# Patient Record
Sex: Female | Born: 2000 | Race: Black or African American | Hispanic: No | Marital: Single | Smoking: Former smoker
Health system: Southern US, Community
[De-identification: ages and names within clinical notes are randomized; demographics above are authoritative.]

## PROBLEM LIST (undated history)

## (undated) DIAGNOSIS — Z872 Personal history of diseases of the skin and subcutaneous tissue: Secondary | ICD-10-CM

## (undated) DIAGNOSIS — Z789 Other specified health status: Secondary | ICD-10-CM

## (undated) HISTORY — DX: Other specified health status: Z78.9

## (undated) HISTORY — DX: Personal history of diseases of the skin and subcutaneous tissue: Z87.2

## (undated) HISTORY — PX: NO PAST SURGERIES: SHX2092

---
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
## 2018-02-20 ENCOUNTER — Ambulatory Visit (INDEPENDENT_AMBULATORY_CARE_PROVIDER_SITE_OTHER): Payer: Self-pay | Admitting: Internal Medicine

## 2018-02-20 ENCOUNTER — Other Ambulatory Visit (HOSPITAL_COMMUNITY)
Admission: RE | Admit: 2018-02-20 | Discharge: 2018-02-20 | Disposition: A | Discharge status: Home / Self Care | Payer: Medicaid Other | Source: Ambulatory Visit | Attending: Internal Medicine | Admitting: Internal Medicine

## 2018-02-20 ENCOUNTER — Encounter (INDEPENDENT_AMBULATORY_CARE_PROVIDER_SITE_OTHER): Payer: Self-pay | Admitting: Internal Medicine

## 2018-02-20 VITALS — BP 112/79 | HR 101 | Temp 98.1°F | Ht 63.0 in | Wt 125.4 lb

## 2018-02-20 DIAGNOSIS — Z113 Encounter for screening for infections with a predominantly sexual mode of transmission: Secondary | ICD-10-CM | POA: Insufficient documentation

## 2018-02-20 DIAGNOSIS — Z3202 Encounter for pregnancy test, result negative: Secondary | ICD-10-CM

## 2018-02-20 DIAGNOSIS — N911 Secondary amenorrhea: Secondary | ICD-10-CM

## 2018-02-20 DIAGNOSIS — L309 Dermatitis, unspecified: Secondary | ICD-10-CM

## 2018-02-20 LAB — POCT URINE PREGNANCY: Preg Test, Ur: NEGATIVE

## 2018-02-20 MED ORDER — HYDROXYZINE HCL 25 MG PO TABS: 25.0000 mg | ORAL_TABLET | Freq: Every day | ORAL | 0 refills | Status: DC

## 2018-02-20 NOTE — Progress Notes (Signed)
Patient ID: Molly Evans, female    DOB: 02/19/2000  MRN: [id number]  CC: Exposure to STD   Subjective: Molly Evans is a 18 y.o. female who presents for UC visit.   Her concerns today include:   Pt requesting STD screen.  No vaginal dischg or itching at this time.  Sexually active with 2 partners in past one yr. requesting STD check because on her last intermittent encounter she states that her boyfriend took off the condom.  She declined blood draw for HIV and syphilis screen.  Seen by derm at [facility] for chronic rash and itching.  Pt states she was told that she has dry skin. Prescribed  a pill ?Zyrtec and did not find it helpful.  Also prescribed a cream (Halobetalol) but it is not covered by her Medicaid.  She would like refill on hydroxyzine.  She reports that this medication has been helpful in decreasing the itching.  Has a f/u appt later this mth.   Patient Active Problem List   Diagnosis Date Noted  . Chlamydia infection 04/20/2017     Current Outpatient Medications on File Prior to Visit  Medication Sig Dispense Refill  . hydrOXYzine (ATARAX/VISTARIL) 25 MG tablet Take 1 tablet (25 mg total) by mouth at bedtime. 30 tablet 1  . medroxyPROGESTERone (DEPO-PROVERA) 150 MG/ML injection Inject 1 mL (150 mg total) into the muscle every 3 (three) months. 1 mL 3   No current facility-administered medications on file prior to visit.     No Known Allergies  Social History   Socioeconomic History  . Marital status: Single    Spouse name: Not on file  . Number of children: Not on file  . Years of education: Not on file  . Highest education level: Not on file  Occupational History  . Not on file  Social Needs  . Financial resource strain: Not on file  . Food insecurity:    Worry: Not on file    Inability: Not on file  . Transportation needs:    Medical: Not on file    Non-medical: Not on file  Tobacco Use  . Smoking status: Current Every Day  Smoker    Types: Cigars  . Smokeless tobacco: Never Used  Substance and Sexual Activity  . Alcohol use: No  . Drug use: No  . Sexual activity: Yes    Partners: Male    Birth control/protection: Condom  Lifestyle  . Physical activity:    Days per week: Not on file    Minutes per session: Not on file  . Stress: Not on file  Relationships  . Social connections:    Talks on phone: Not on file    Gets together: Not on file    Attends religious service: Not on file    Active member of club or organization: Not on file    Attends meetings of clubs or organizations: Not on file    Relationship status: Not on file  . Intimate partner violence:    Fear of current or ex partner: Not on file    Emotionally abused: Not on file    Physically abused: Not on file    Forced sexual activity: Not on file  Other Topics Concern  . Not on file  Social History Narrative   8th grade, desiring to go out for track 2014, does hair styling, exercise - running    Family History  Problem Relation Age of Onset  . Hypertension Mother   .  Heart disease Maternal Uncle   . Cancer Neg Hx   . Diabetes Neg Hx     No past surgical history on file.  ROS: Review of Systems Negative except as above. PHYSICAL EXAM: BP 112/79 (BP Location: Left Arm, Patient Position: Sitting, Cuff Size: Normal)   Pulse (!) 101   Temp 98.1 F (36.7 C) (Oral)   Ht 5\' 3"  (1.6 m)   Wt 125 lb 6.4 oz (56.9 kg)   LMP 01/20/2018 (Approximate)   SpO2 93%   BMI 22.21 kg/m   Physical Exam  General appearance - alert, well appearing, and in no distress Mental status - normal mood, behavior, speech, dress, motor activity, and thought processes Skin -scattered hyperpigmented macular rash on the right thigh, left popliteal area and upper arms.  Results for orders placed or performed in visit on 02/20/18  POCT urine pregnancy  Result Value Ref Range   Preg Test, Ur Negative Negative    ASSESSMENT AND PLAN: 1. Routine  screening for STI (sexually transmitted infection) Encourage consistent use of condoms.  Patient declines screening test for HIV and syphilis - Urine cytology ancillary only  2. Dermatitis Patient to keep follow-up appointment with dermatologist later this month. - hydrOXYzine (ATARAX/VISTARIL) 25 MG tablet; Take 1 tablet (25 mg total) by mouth at bedtime.  Dispense: 30 tablet; Refill: 0  3. Secondary amenorrhea - POCT urine pregnancy  Patient was given the opportunity to ask questions.  Patient verbalized understanding of the plan and was able to repeat key elements of the plan.   No orders of the defined types were placed in this encounter.    Requested Prescriptions    No prescriptions requested or ordered in this encounter    No follow-ups on file.  Jonah Blue, MD, FACP

## 2018-02-20 NOTE — Patient Instructions (Signed)
I recommend using condoms consistently to prevent acquiring a sexually transmitted infection.

## 2018-02-21 LAB — URINE CYTOLOGY ANCILLARY ONLY
CHLAMYDIA, DNA PROBE: NEGATIVE
Neisseria Gonorrhea: NEGATIVE
TRICH (WINDOWPATH): NEGATIVE

## 2018-02-23 ENCOUNTER — Telehealth: Payer: Self-pay

## 2018-02-23 NOTE — Telephone Encounter (Signed)
Contacted pt to go over lab results pt answered then hung up. Tried contacting pt back and there was no answer

## 2018-03-10 ENCOUNTER — Ambulatory Visit (HOSPITAL_COMMUNITY)
Admission: EM | Admit: 2018-03-10 | Discharge: 2018-03-10 | Disposition: A | Discharge status: Home / Self Care | Payer: Self-pay | Attending: Family Medicine | Admitting: Family Medicine

## 2018-03-10 ENCOUNTER — Encounter (HOSPITAL_COMMUNITY): Payer: Self-pay | Admitting: Emergency Medicine

## 2018-03-10 DIAGNOSIS — L2082 Flexural eczema: Secondary | ICD-10-CM

## 2018-03-10 MED ORDER — CETIRIZINE HCL 10 MG PO TABS: 10.0000 mg | ORAL_TABLET | Freq: Two times a day (BID) | ORAL | 0 refills | Status: DC

## 2018-03-10 MED ORDER — PREDNISONE 50 MG PO TABS: ORAL_TABLET | ORAL | 0 refills | Status: DC

## 2018-03-10 MED ORDER — TRIAMCINOLONE ACETONIDE 0.1 % EX OINT: 1.0000 "application " | TOPICAL_OINTMENT | Freq: Two times a day (BID) | CUTANEOUS | 0 refills | Status: DC

## 2018-03-10 NOTE — ED Triage Notes (Signed)
Pt here for rash to arm that is irritated

## 2018-03-10 NOTE — ED Provider Notes (Signed)
[facility]    CSN: [id number] Arrival date & time: 03/10/18  1032     History   Chief Complaint Chief Complaint  Patient presents with  . Rash    HPI Molly Evans is a 18 y.o. female.   HPI  Patient has longstanding eczema.  She is under the care of primary care doctor and a dermatologist.  She went to the dermatologist and was prescribed halobetasol.  This is not covered under her insurance.  Cost $300.  She called back to the dermatologist was unable to reach anybody.  She therefore is on treated.  She does have hydroxyzine to use for itching.  If she takes enough to relieve the itching it makes her drowsy.  She is not able to take this at work.  She did not go to work today because the itching was so terrible on her arms and in both axilla that she did not feel able to concentrate on her job.  She states the skin in her armpits "burns".  History reviewed. No pertinent past medical history.  Patient Active Problem List   Diagnosis Date Noted  . Chlamydia infection 04/20/2017    History reviewed. No pertinent surgical history.  OB History   No obstetric history on file.      Home Medications    Prior to Admission medications   Medication Sig Start Date End Date Taking? Authorizing Provider  cetirizine (ZYRTEC) 10 MG tablet Take 1 tablet (10 mg total) by mouth 2 (two) times daily. 03/10/18   Eustace Moore, MD  hydrOXYzine (ATARAX/VISTARIL) 25 MG tablet Take 1 tablet (25 mg total) by mouth at bedtime. 02/20/18   Marcine Matar, MD  medroxyPROGESTERone (DEPO-PROVERA) 150 MG/ML injection Inject 1 mL (150 mg total) into the muscle every 3 (three) months. 12/26/17   Loletta Specter, PA-C  predniSONE (DELTASONE) 50 MG tablet One a day for 3 d 03/10/18   Eustace Moore, MD  triamcinolone ointment (KENALOG) 0.1 % Apply 1 application topically 2 (two) times daily. 03/10/18   Eustace Moore, MD    Family History Family History  Problem Relation  Age of Onset  . Hypertension Mother   . Heart disease Maternal Uncle   . Cancer Neg Hx   . Diabetes Neg Hx     Social History Social History   Tobacco Use  . Smoking status: Current Every Day Smoker    Types: Cigars  . Smokeless tobacco: Never Used  Substance Use Topics  . Alcohol use: No  . Drug use: No     Allergies   Patient has no known allergies.   Review of Systems Review of Systems  Constitutional: Negative for chills and fever.  HENT: Negative for ear pain and sore throat.   Eyes: Negative for pain and visual disturbance.  Respiratory: Negative for cough and shortness of breath.   Cardiovascular: Negative for chest pain and palpitations.  Gastrointestinal: Negative for abdominal pain and vomiting.  Genitourinary: Negative for dysuria and hematuria.  Musculoskeletal: Negative for arthralgias and back pain.  Skin: Positive for rash. Negative for color change.  Neurological: Negative for seizures and syncope.  All other systems reviewed and are negative.    Physical Exam Triage Vital Signs ED Triage Vitals [03/10/18 1056]  Enc Vitals Group     BP (!) 121/58     Pulse Rate 74     Resp 18     Temp 98.4 F (36.9 C)  Temp Source Temporal     SpO2 100 %     Weight      Height      Head Circumference      Peak Flow      Pain Score 0     Pain Loc      Pain Edu?      Excl. in GC?    No data found.  Updated Vital Signs BP (!) 121/58 (BP Location: Right Arm)   Pulse 74   Temp 98.4 F (36.9 C) (Temporal)   Resp 18   SpO2 100%   Visual Acuity Right Eye Distance:   Left Eye Distance:   Bilateral Distance:    Right Eye Near:   Left Eye Near:    Bilateral Near:     Physical Exam Constitutional:      General: She is not in acute distress.    Appearance: She is well-developed.  HENT:     Head: Normocephalic and atraumatic.  Eyes:     Conjunctiva/sclera: Conjunctivae normal.     Pupils: Pupils are equal, round, and reactive to light.    Neck:     Musculoskeletal: Normal range of motion.  Cardiovascular:     Rate and Rhythm: Normal rate.  Pulmonary:     Effort: Pulmonary effort is normal. No respiratory distress.  Abdominal:     General: There is no distension.     Palpations: Abdomen is soft.  Musculoskeletal: Normal range of motion.  Skin:    General: Skin is warm and dry.     Comments: Hyperpigmented, rough, inflamed skin present on both antecubital fossa, both deltoid region of upper arm and both axilla, no evidence of cellulitis infection or exudate  Neurological:     General: No focal deficit present.     Mental Status: She is alert.  Psychiatric:        Mood and Affect: Mood normal.        Behavior: Behavior normal.      UC Treatments / Results  Labs (all labs ordered are listed, but only abnormal results are displayed) Labs Reviewed - No data to display  EKG None  Radiology No results found.  Procedures Procedures (including critical care time)  Medications Ordered in UC Medications - No data to display  Initial Impression / Assessment and Plan / UC Course  I have reviewed the triage vital signs and the nursing notes.  Pertinent labs & imaging results that were available during my care of the patient were reviewed by me and considered in my medical decision making (see chart for details).      Final Clinical Impressions(s) / UC Diagnoses   Final diagnoses:  Flexural eczema     Discharge Instructions     The cetirizine (Zyrtec) 2 times a day Take prednisone 1 a day for 3 days Use the triamcinolone ointment 2 times a day Follow-up with your dermatologist next week   ED Prescriptions    Medication Sig Dispense Auth. Provider   cetirizine (ZYRTEC) 10 MG tablet Take 1 tablet (10 mg total) by mouth 2 (two) times daily. 30 tablet Eustace MooreNelson, Ramya Vanbergen Sue, MD   triamcinolone ointment (KENALOG) 0.1 % Apply 1 application topically 2 (two) times daily. 30 g Eustace MooreNelson, Anjoli Diemer Sue, MD    predniSONE (DELTASONE) 50 MG tablet One a day for 3 d 3 tablet Eustace MooreNelson, Jered Heiny Sue, MD     Controlled Substance Prescriptions [place] Controlled Substance Registry consulted? Not Applicable   Eustace Mooreelson, Raynald Rouillard Sue,  MD 03/10/18 1134

## 2018-03-10 NOTE — Discharge Instructions (Signed)
The cetirizine (Zyrtec) 2 times a day Take prednisone 1 a day for 3 days Use the triamcinolone ointment 2 times a day Follow-up with your dermatologist next week

## 2018-03-23 ENCOUNTER — Ambulatory Visit (HOSPITAL_COMMUNITY)
Admission: EM | Admit: 2018-03-23 | Discharge: 2018-03-23 | Disposition: A | Discharge status: Home / Self Care | Payer: Self-pay | Attending: Family Medicine | Admitting: Family Medicine

## 2018-03-23 ENCOUNTER — Encounter (HOSPITAL_COMMUNITY): Payer: Self-pay | Admitting: Emergency Medicine

## 2018-03-23 DIAGNOSIS — R21 Rash and other nonspecific skin eruption: Secondary | ICD-10-CM

## 2018-03-23 MED ORDER — TRIAMCINOLONE ACETONIDE 0.1 % EX CREA: 1.0000 "application " | TOPICAL_CREAM | Freq: Two times a day (BID) | CUTANEOUS | 0 refills | Status: DC

## 2018-03-23 NOTE — ED Provider Notes (Signed)
[facility]   [id number] 03/23/18 Arrival Time: 1132  ASSESSMENT & PLAN:  1. Rash and nonspecific skin eruption     Meds ordered this encounter  Medications  . triamcinolone cream (KENALOG) 0.1 %    Sig: Apply 1 application topically 2 (two) times daily. For no more than one week.    Dispense:  15 g    Refill:  0   Will f/u here if not seeing improvement over the next week.  Reviewed expectations re: course of current medical issues. Questions answered. Outlined signs and symptoms indicating need for more acute intervention. Patient verbalized understanding. After Visit Summary given.   SUBJECTIVE:  Molly Evans is a 18 y.o. female who presents with a skin complaint.   Location: R cheek along mandible towards ear Onset: abrupt Duration: 2-3 days of itching; "small bumps" noted yesterday Associated pruritis? moderate Associated pain? none Drainage? No  Known trigger? No  New soaps/lotions/topicals/detergents/environmental exposures? No Contacts with similar? No Recent travel? No  Other associated symptoms: none Therapies tried thus far: none Arthralgia or myalgia? none Recent illness? none Fever? none No specific aggravating or alleviating factors reported. No h/o similar. H/O eczema though.  ROS: As per HPI.  OBJECTIVE: Vitals:   03/23/18 1156  BP: 104/61  Pulse: 66  Resp: 18  Temp: 98.6 F (37 C)  TempSrc: Temporal  SpO2: 100%    General appearance: alert; no distress Lungs: clear to auscultation bilaterally Heart: regular rate and rhythm Extremities: no edema Skin: warm and dry; signs of infection: no; along R mandible extending toward ear is an subtle papular rash; minimal erythema; no blistering or ulcerations; no tenderness to touch Psychological: alert and cooperative; normal mood and affect  No Known Allergies   Social History   Socioeconomic History  . Marital status: Single    Spouse name: Not on file  . Number of  children: Not on file  . Years of education: Not on file  . Highest education level: Not on file  Occupational History  . Not on file  Social Needs  . Financial resource strain: Not on file  . Food insecurity:    Worry: Not on file    Inability: Not on file  . Transportation needs:    Medical: Not on file    Non-medical: Not on file  Tobacco Use  . Smoking status: Current Every Day Smoker    Types: Cigars  . Smokeless tobacco: Never Used  Substance and Sexual Activity  . Alcohol use: No  . Drug use: No  . Sexual activity: Yes    Partners: Male    Birth control/protection: Condom  Lifestyle  . Physical activity:    Days per week: Not on file    Minutes per session: Not on file  . Stress: Not on file  Relationships  . Social connections:    Talks on phone: Not on file    Gets together: Not on file    Attends religious service: Not on file    Active member of club or organization: Not on file    Attends meetings of clubs or organizations: Not on file    Relationship status: Not on file  . Intimate partner violence:    Fear of current or ex partner: Not on file    Emotionally abused: Not on file    Physically abused: Not on file    Forced sexual activity: Not on file  Other Topics Concern  . Not on file  Social  History Narrative   8th grade, desiring to go out for track 2014, does hair styling, exercise - running   Family History  Problem Relation Age of Onset  . Hypertension Mother   . Heart disease Maternal Uncle   . Cancer Neg Hx   . Diabetes Neg Hx    History reviewed. No pertinent surgical history.   Mardella Layman, MD 03/23/18 1349

## 2018-03-23 NOTE — ED Triage Notes (Signed)
Pt sts rash to face

## 2018-03-24 ENCOUNTER — Ambulatory Visit (INDEPENDENT_AMBULATORY_CARE_PROVIDER_SITE_OTHER): Payer: Self-pay | Admitting: Primary Care

## 2018-03-24 ENCOUNTER — Encounter (INDEPENDENT_AMBULATORY_CARE_PROVIDER_SITE_OTHER): Payer: Self-pay | Admitting: Primary Care

## 2018-03-24 ENCOUNTER — Other Ambulatory Visit: Payer: Self-pay

## 2018-03-24 VITALS — BP 110/75 | HR 83 | Temp 97.7°F | Ht 63.0 in | Wt 123.0 lb

## 2018-03-24 DIAGNOSIS — Z7251 High risk heterosexual behavior: Secondary | ICD-10-CM

## 2018-03-24 DIAGNOSIS — Z23 Encounter for immunization: Secondary | ICD-10-CM

## 2018-03-24 DIAGNOSIS — L259 Unspecified contact dermatitis, unspecified cause: Secondary | ICD-10-CM

## 2018-03-24 NOTE — Progress Notes (Signed)
Acute Office Visit  Subjective:    Patient ID: Molly Evans, female    DOB: 10-22-2000, 18 y.o.   MRN: [id number]  Chief Complaint  Patient presents with  . Rash    right side of face     HPI Patient is in today for an acute visit for contact dermatitis rash/hive on face. Patient admits to staying over a friends house dogs and not sure what detergent used on sheets. PMH of STD and eczema.  History reviewed. No pertinent past medical history.  History reviewed. No pertinent surgical history.  Family History  Problem Relation Age of Onset  . Hypertension Mother   . Heart disease Maternal Uncle   . Cancer Neg Hx   . Diabetes Neg Hx     Social History   Socioeconomic History  . Marital status: Single    Spouse name: Not on file  . Number of children: Not on file  . Years of education: Not on file  . Highest education level: Not on file  Occupational History  . Not on file  Social Needs  . Financial resource strain: Not on file  . Food insecurity:    Worry: Not on file    Inability: Not on file  . Transportation needs:    Medical: Not on file    Non-medical: Not on file  Tobacco Use  . Smoking status: Current Every Day Smoker    Types: Cigars  . Smokeless tobacco: Never Used  Substance and Sexual Activity  . Alcohol use: No  . Drug use: No  . Sexual activity: Yes    Partners: Male    Birth control/protection: Condom  Lifestyle  . Physical activity:    Days per week: Not on file    Minutes per session: Not on file  . Stress: Not on file  Relationships  . Social connections:    Talks on phone: Not on file    Gets together: Not on file    Attends religious service: Not on file    Active member of club or organization: Not on file    Attends meetings of clubs or organizations: Not on file    Relationship status: Not on file  . Intimate partner violence:    Fear of current or ex partner: Not on file    Emotionally abused: Not on file    Physically  abused: Not on file    Forced sexual activity: Not on file  Other Topics Concern  . Not on file  Social History Narrative   8th grade, desiring to go out for track 2014, does hair styling, exercise - running    Outpatient Medications Prior to Visit  Medication Sig Dispense Refill  . cetirizine (ZYRTEC) 10 MG tablet Take 1 tablet (10 mg total) by mouth 2 (two) times daily. (Patient not taking: Reported on 03/24/2018) 30 tablet 0  . hydrOXYzine (ATARAX/VISTARIL) 25 MG tablet Take 1 tablet (25 mg total) by mouth at bedtime. (Patient not taking: Reported on 03/24/2018) 30 tablet 0  . triamcinolone cream (KENALOG) 0.1 % Apply 1 application topically 2 (two) times daily. For no more than one week. (Patient not taking: Reported on 03/24/2018) 15 g 0  . medroxyPROGESTERone (DEPO-PROVERA) 150 MG/ML injection Inject 1 mL (150 mg total) into the muscle every 3 (three) months. 1 mL 3  . predniSONE (DELTASONE) 50 MG tablet One a day for 3 d 3 tablet 0   No facility-administered medications prior to visit.  No Known Allergies  Review of Systems  Constitutional: Negative.   HENT: Negative.   Eyes: Negative.   Respiratory: Negative.   Cardiovascular: Negative.   Gastrointestinal: Negative.   Genitourinary: Negative.   Musculoskeletal: Negative.   Skin: Positive for rash.  Neurological: Negative.   Endo/Heme/Allergies: Negative.   Psychiatric/Behavioral: Negative.        Objective:    Physical Exam  Constitutional: She is oriented to person, place, and time. She appears well-developed.  HENT:  Head: Normocephalic.  Neck: Normal range of motion.  Cardiovascular: Normal rate and regular rhythm.  Pulmonary/Chest: Breath sounds normal.  Musculoskeletal: Normal range of motion.  Neurological: She is alert and oriented to person, place, and time.  Skin: Rash noted.  Psychiatric: She has a normal mood and affect.    BP 110/75 (BP Location: Right Arm, Patient Position: Sitting, Cuff Size:  Normal)   Pulse 83   Temp 97.7 F (36.5 C) (Oral)   Ht _0  (1.6 m)   Wt 123 lb (55.8 kg)   LMP 03/03/2018 (Approximate)   SpO2 93%   BMI 21.79 kg/m  Wt Readings from Last 3 Encounters:  03/24/18 123 lb (55.8 kg) (42 %, Z= -0.20)*  02/20/18 125 lb 6.4 oz (56.9 kg) (47 %, Z= -0.07)*  12/26/17 122 lb 12.8 oz (55.7 kg) (43 %, Z= -0.19)*   * Growth percentiles are based on CDC (Girls, 2-20 Years) data.    There are no preventive care reminders to display for this patient.  There are no preventive care reminders to display for this patient.   No results found for: TSH No results found for: WBC, HGB, HCT, MCV, PLT No results found for: NA, K, CHLORIDE, CO2, GLUCOSE, BUN, CREATININE, BILITOT, ALKPHOS, AST, ALT, PROT, ALBUMIN, CALCIUM, ANIONGAP, EGFR, GFR No results found for: CHOL No results found for: HDL No results found for: LDLCALC No results found for: TRIG No results found for: CHOLHDL Lab Results  Component Value Date   HGBA1C 5.3 10/24/2017       Assessment & Plan:   Problem List Items Addressed This Visit    None    Visit Diagnoses    Need for Tdap vaccination    -  Primary   Relevant Orders   Tdap vaccine greater than or equal to 7yo IM (Completed)   High risk sexual behavior, unspecified type       Contact dermatitis and eczema        1. Need for Tdap vaccination Completed  - Tdap vaccine greater than or equal to 7yo IM  2. High risk sexual behavior, unspecified type  discussed  STD risk and protection use  Condoms and given samples  3. Contact dermatitis and eczema Benadryl for itching rash hives and has a Dermatology appt scheduled per pt      [name], NP

## 2018-03-24 NOTE — Patient Instructions (Signed)
Contact Dermatitis  Dermatitis is redness, soreness, and swelling (inflammation) of the skin. Contact dermatitis is a reaction to something that touches the skin.  There are two types of contact dermatitis:   Irritant contact dermatitis. This happens when something bothers (irritates) your skin, like soap.   Allergic contact dermatitis. This is caused when you are exposed to something that you are allergic to, such as poison ivy.  What are the causes?   Common causes of irritant contact dermatitis include:  ? Makeup.  ? Soaps.  ? Detergents.  ? Bleaches.  ? Acids.  ? Metals, such as nickel.   Common causes of allergic contact dermatitis include:  ? Plants.  ? Chemicals.  ? Jewelry.  ? Latex.  ? Medicines.  ? Preservatives in products, such as clothing.  What increases the risk?   Having a job that exposes you to things that bother your skin.   Having asthma or eczema.  What are the signs or symptoms?  Symptoms may happen anywhere the irritant has touched your skin. Symptoms include:   Dry or flaky skin.   Redness.   Cracks.   Itching.   Pain or a burning feeling.   Blisters.   Blood or clear fluid draining from skin cracks.  With allergic contact dermatitis, swelling may occur. This may happen in places such as the eyelids, mouth, or genitals.  How is this treated?   This condition is treated by checking for the cause of the reaction and protecting your skin. Treatment may also include:  ? Steroid creams, ointments, or medicines.  ? Antibiotic medicines or other ointments, if you have a skin infection.  ? Lotion or medicines to help with itching.  ? A bandage (dressing).  Follow these instructions at home:  Skin care   Moisturize your skin as needed.   Put cool cloths on your skin.   Put a baking soda paste on your skin. Stir water into baking soda until it looks like a paste.   Do not scratch your skin.   Avoid having things rub up against your skin.   Avoid the use of soaps, perfumes, and  dyes.  Medicines   Take or apply over-the-counter and prescription medicines only as told by your doctor.   If you were prescribed an antibiotic medicine, take or apply it as told by your doctor. Do not stop using it even if your condition starts to get better.  Bathing   Take a bath with:  ? Epsom salts.  ? Baking soda.  ? Colloidal oatmeal.   Bathe less often.   Bathe in warm water. Avoid using hot water.  Bandage care   If you were given a bandage, change it as told by your health care provider.   Wash your hands with soap and water before and after you change your bandage. If soap and water are not available, use hand sanitizer.  General instructions   Avoid the things that caused your reaction. If you do not know what caused it, keep a journal. Write down:  ? What you eat.  ? What skin products you use.  ? What you drink.  ? What you wear in the area that has symptoms. This includes jewelry.   Check the affected areas every day for signs of infection. Check for:  ? More redness, swelling, or pain.  ? More fluid or blood.  ? Warmth.  ? Pus or a bad smell.   Keep all follow-up visits as   told by your doctor. This is important.  Contact a doctor if:   You do not get better with treatment.   Your condition gets worse.   You have signs of infection, such as:  ? More swelling.  ? Tenderness.  ? More redness.  ? Soreness.  ? Warmth.   You have a fever.   You have new symptoms.  Get help right away if:   You have a very bad headache.   You have neck pain.   Your neck is stiff.   You throw up (vomit).   You feel very sleepy.   You see red streaks coming from the area.   Your bone or joint near the area hurts after the skin has healed.   The area turns darker.   You have trouble breathing.  Summary   Dermatitis is redness, soreness, and swelling of the skin.   Symptoms may occur where the irritant has touched you.   Treatment may include medicines and skin care.   If you do not know what caused  your reaction, keep a journal.   Contact a doctor if your condition gets worse or you have signs of infection.  This information is not intended to replace advice given to you by your health care provider. Make sure you discuss any questions you have with your health care provider.  Document Released: 11/01/2008 Document Revised: 07/20/2017 Document Reviewed: 07/20/2017  Elsevier Interactive Patient Education  2019 [employer]

## 2018-03-24 NOTE — Progress Notes (Signed)
tdaPt states she slept over at a friends on Tuesday night woke up Wednesday morning and her face was broke out with bumps. She took benadryl and it cleared the break out slightly.

## 2018-05-23 ENCOUNTER — Other Ambulatory Visit: Payer: Self-pay

## 2018-05-23 ENCOUNTER — Ambulatory Visit (HOSPITAL_COMMUNITY)
Admission: EM | Admit: 2018-05-23 | Discharge: 2018-05-23 | Disposition: A | Discharge status: Home / Self Care | Payer: Medicaid Other | Attending: Family Medicine | Admitting: Family Medicine

## 2018-05-23 ENCOUNTER — Encounter (HOSPITAL_COMMUNITY): Payer: Self-pay

## 2018-05-23 DIAGNOSIS — R1032 Left lower quadrant pain: Secondary | ICD-10-CM | POA: Insufficient documentation

## 2018-05-23 LAB — POCT URINALYSIS DIP (DEVICE)
Bilirubin Urine: NEGATIVE
Glucose, UA: NEGATIVE mg/dL
Ketones, ur: NEGATIVE mg/dL
Nitrite: NEGATIVE
Protein, ur: NEGATIVE mg/dL
Specific Gravity, Urine: 1.025 (ref 1.005–1.030)
Urobilinogen, UA: 0.2 mg/dL (ref 0.0–1.0)
pH: 6 (ref 5.0–8.0)

## 2018-05-23 LAB — CBC WITH DIFFERENTIAL/PLATELET
Abs Immature Granulocytes: 0.01 10*3/uL (ref 0.00–0.07)
Basophils Absolute: 0 10*3/uL (ref 0.0–0.1)
Basophils Relative: 0 %
Eosinophils Absolute: 0.1 10*3/uL (ref 0.0–0.5)
Eosinophils Relative: 2 %
HCT: 38.8 % (ref 36.0–46.0)
Hemoglobin: 12.7 g/dL (ref 12.0–15.0)
Immature Granulocytes: 0 %
Lymphocytes Relative: 27 %
Lymphs Abs: 1.7 10*3/uL (ref 0.7–4.0)
MCH: 27 pg (ref 26.0–34.0)
MCHC: 32.7 g/dL (ref 30.0–36.0)
MCV: 82.4 fL (ref 80.0–100.0)
Monocytes Absolute: 0.5 10*3/uL (ref 0.1–1.0)
Monocytes Relative: 8 %
Neutro Abs: 4 10*3/uL (ref 1.7–7.7)
Neutrophils Relative %: 63 %
Platelets: 240 10*3/uL (ref 150–400)
RBC: 4.71 MIL/uL (ref 3.87–5.11)
RDW: 12.9 % (ref 11.5–15.5)
WBC: 6.4 10*3/uL (ref 4.0–10.5)
nRBC: 0 % (ref 0.0–0.2)

## 2018-05-23 LAB — BASIC METABOLIC PANEL
Anion gap: 6 (ref 5–15)
BUN: 6 mg/dL (ref 6–20)
CO2: 25 mmol/L (ref 22–32)
Calcium: 8.8 mg/dL — ABNORMAL LOW (ref 8.9–10.3)
Chloride: 109 mmol/L (ref 98–111)
Creatinine, Ser: 0.88 mg/dL (ref 0.44–1.00)
GFR calc Af Amer: >60 mL/min (ref >60)
GFR calc non Af Amer: >60 mL/min (ref >60)
Glucose, Bld: 87 mg/dL (ref 70–99)
Potassium: 3.8 mmol/L (ref 3.5–5.1)
Sodium: 140 mmol/L (ref 135–145)

## 2018-05-23 LAB — POCT PREGNANCY, URINE: Preg Test, Ur: NEGATIVE

## 2018-05-23 NOTE — Discharge Instructions (Signed)
You have been seen today for abdominal pain. Your evaluation was not suggestive of any emergent condition requiring medical intervention at this time. However, some abdominal problems make take more time to appear. Therefore, it is very important for you to pay attention to any new symptoms or worsening of your current condition.  Please proceed to the Emergency Department immediately should you begin to feel worse in any way or have any of the following symptoms: increasing or different abdominal pain, persistent vomiting, inability to drink fluids, fevers, or shaking chills.    

## 2018-05-23 NOTE — ED Triage Notes (Signed)
Pt states that she has had 2 periods this month and has been having left sided flank pain, states a 10 lb weight loss past month without change in eating habits. States also that she has experienced constipation, last bm today

## 2018-05-23 NOTE — ED Provider Notes (Signed)
[facility]   [id number] 05/23/18 Arrival Time: 1434  ASSESSMENT & PLAN:  1. Left lower quadrant abdominal pain    Benign abdominal exam. No indications for urgent abdominal/pelvic imaging at this time. Discussed possibility of ovarian cyst or nephrolithiasis given hematuria. No signs of PID. UPT negative. No significant abnormalities on CBC/BMP. Urine culture sent. Vaginal cytology pending.   Discharge Instructions     You have been seen today for abdominal pain. Your evaluation was not suggestive of any emergent condition requiring medical intervention at this time. However, some abdominal problems make take more time to appear. Therefore, it is very important for you to pay attention to any new symptoms or worsening of your current condition.  Please proceed to the Emergency Department immediately should you begin to feel worse in any way or have any of the following symptoms: increasing or different abdominal pain, persistent vomiting, inability to drink fluids, fevers, or shaking chills.    Follow-up Information    Grayce SessionsEdwards, Michelle P, NP.   Specialty:  Internal Medicine Why:  As needed. Contact information: [address] [place] [id number] [phone]        [facility].   Specialty:  Emergency Medicine Why:  If your symptoms worsen in any way. Contact information: [address] [id number] [place] [id number] [phone]         Reviewed expectations re: course of current medical issues. Questions answered. Outlined signs and symptoms indicating need for more acute intervention. Patient verbalized understanding. After Visit Summary given.   SUBJECTIVE: History from: patient. Molly Evans is a 18 y.o. female who presents with complaint of intermittent LLQ abdominal discomfort. Onset gradual, approx 4-5 days ago. Discomfort described as aching; without radiation. Symptoms  are gradually improving since beginning. Fever: absent. Aggravating factors: have not been identified. Alleviating factors: have not been identified. Associated symptoms: none reported. She denies anorexia, arthralgias, belching, chills, constipation, diarrhea, headache, myalgias, nausea and vomiting. Appetite: normal. PO intake: normal. Ambulatory without assistance. Urinary symptoms: none. Bowel movements: are less frequent than before but does report having a normal bowel movement this morning without bleeding. History of similar: no. OTC treatment: Tylenol with mild help.  Patient's last menstrual period was 05/16/2018. "Did have two periods this month."  History reviewed. No pertinent surgical history.  ROS: As per HPI. All other systems negative.  OBJECTIVE:  Vitals:   05/23/18 1459  BP: 107/61  Pulse: 72  Resp: 16  Temp: 97.7 F (36.5 C)  SpO2: 100%    General appearance: alert, oriented, no acute distress Lungs: clear to auscultation bilaterally; unlabored respirations Heart: regular rate and rhythm Abdomen: soft; without distention; mild tenderness over LLQ toward inguinal area; normal bowel sounds; without masses or organomegaly; without guarding or rebound tenderness GU: declines Back: without CVA tenderness; FROM at waist Extremities: without LE edema; symmetrical; without gross deformities Skin: warm and dry Neurologic: normal gait Psychological: alert and cooperative; normal mood and affect  Labs: Results for orders placed or performed during the hospital encounter of 05/23/18  Basic metabolic panel  Result Value Ref Range   Sodium 140 135 - 145 mmol/L   Potassium 3.8 3.5 - 5.1 mmol/L   Chloride 109 98 - 111 mmol/L   CO2 25 22 - 32 mmol/L   Glucose, Bld 87 70 - 99 mg/dL   BUN 6 6 - 20 mg/dL   Creatinine, Ser 2.720.88 0.44 - 1.00 mg/dL   Calcium 8.8 (L) 8.9 -  10.3 mg/dL   GFR calc non Af Amer >60 >60 mL/min   GFR calc Af Amer >60 >60 mL/min   Anion gap 6 5 - 15   CBC with Differential  Result Value Ref Range   WBC 6.4 4.0 - 10.5 K/uL   RBC 4.71 3.87 - 5.11 MIL/uL   Hemoglobin 12.7 12.0 - 15.0 g/dL   HCT 16.1 09.6 - 04.5 %   MCV 82.4 80.0 - 100.0 fL   MCH 27.0 26.0 - 34.0 pg   MCHC 32.7 30.0 - 36.0 g/dL   RDW 40.9 81.1 - 91.4 %   Platelets 240 150 - 400 K/uL   nRBC 0.0 0.0 - 0.2 %   Neutrophils Relative % 63 %   Neutro Abs 4.0 1.7 - 7.7 K/uL   Lymphocytes Relative 27 %   Lymphs Abs 1.7 0.7 - 4.0 K/uL   Monocytes Relative 8 %   Monocytes Absolute 0.5 0.1 - 1.0 K/uL   Eosinophils Relative 2 %   Eosinophils Absolute 0.1 0.0 - 0.5 K/uL   Basophils Relative 0 %   Basophils Absolute 0.0 0.0 - 0.1 K/uL   Immature Granulocytes 0 %   Abs Immature Granulocytes 0.01 0.00 - 0.07 K/uL  POCT urinalysis dip (device)  Result Value Ref Range   Glucose, UA NEGATIVE NEGATIVE mg/dL   Bilirubin Urine NEGATIVE NEGATIVE   Ketones, ur NEGATIVE NEGATIVE mg/dL   Specific Gravity, Urine 1.025 1.005 - 1.030   Hgb urine dipstick LARGE (A) NEGATIVE   pH 6.0 5.0 - 8.0   Protein, ur NEGATIVE NEGATIVE mg/dL   Urobilinogen, UA 0.2 0.0 - 1.0 mg/dL   Nitrite NEGATIVE NEGATIVE   Leukocytes,Ua SMALL (A) NEGATIVE  Pregnancy, urine POC  Result Value Ref Range   Preg Test, Ur NEGATIVE NEGATIVE    No Known Allergies                                              Social History   Socioeconomic History  . Marital status: Single    Spouse name: Not on file  . Number of children: Not on file  . Years of education: Not on file  . Highest education level: Not on file  Occupational History  . Not on file  Social Needs  . Financial resource strain: Not on file  . Food insecurity:    Worry: Not on file    Inability: Not on file  . Transportation needs:    Medical: Not on file    Non-medical: Not on file  Tobacco Use  . Smoking status: Current Every Day Smoker    Types: Cigars  . Smokeless tobacco: Never Used  Substance and Sexual Activity  . Alcohol use: No  .  Drug use: No  . Sexual activity: Yes    Partners: Male    Birth control/protection: Condom  Lifestyle  . Physical activity:    Days per week: Not on file    Minutes per session: Not on file  . Stress: Not on file  Relationships  . Social connections:    Talks on phone: Not on file    Gets together: Not on file    Attends religious service: Not on file    Active member of club or organization: Not on file    Attends meetings of clubs or organizations: Not on file  Relationship status: Not on file  . Intimate partner violence:    Fear of current or ex partner: Not on file    Emotionally abused: Not on file    Physically abused: Not on file    Forced sexual activity: Not on file  Other Topics Concern  . Not on file  Social History Narrative   8th grade, desiring to go out for track 2014, does hair styling, exercise - running   Family History  Problem Relation Age of Onset  . Hypertension Mother   . Heart disease Maternal Uncle   . Cancer Neg Hx   . Diabetes Neg Hx      Mardella Layman, MD 05/24/18 [phone]

## 2018-05-24 LAB — URINE CULTURE: Culture: NO GROWTH

## 2018-05-24 LAB — CERVICOVAGINAL ANCILLARY ONLY
Bacterial vaginitis: POSITIVE — AB
Candida vaginitis: POSITIVE — AB
Chlamydia: NEGATIVE
Neisseria Gonorrhea: NEGATIVE
Trichomonas: NEGATIVE

## 2018-05-25 ENCOUNTER — Telehealth (HOSPITAL_COMMUNITY): Payer: Self-pay | Admitting: Emergency Medicine

## 2018-05-25 MED ORDER — METRONIDAZOLE 500 MG PO TABS: 500.0000 mg | ORAL_TABLET | Freq: Two times a day (BID) | ORAL | 0 refills | Status: AC

## 2018-05-25 MED ORDER — FLUCONAZOLE 150 MG PO TABS: 150.0000 mg | ORAL_TABLET | Freq: Once | ORAL | 0 refills | Status: AC

## 2018-05-25 NOTE — Telephone Encounter (Signed)
Normal Labs  Bacterial vaginosis is positive. This was not treated at the urgent care visit.  Flagyl 500 mg BID x 7 days #14 no refills sent to patients pharmacy of choice.    Test for candida (yeast) was positive.  Prescription for fluconazole 150mg  po now, repeat dose in 3d if needed, #2 no refills, sent to the pharmacy of record.  Recheck or followup with PCP for further evaluation if symptoms are not improving.    Attempted to reach patient. No answer at this time. Voicemail left.

## 2018-05-26 ENCOUNTER — Telehealth (HOSPITAL_COMMUNITY): Payer: Self-pay | Admitting: Emergency Medicine

## 2018-05-26 NOTE — Telephone Encounter (Signed)
Attempted to reach patient x2. Mother answered, stated she was at work, will call back either today or tomorrow.

## 2018-05-26 NOTE — Telephone Encounter (Signed)
Patient contacted and made aware of all results, all questions answered.   

## 2018-07-07 ENCOUNTER — Other Ambulatory Visit: Payer: Self-pay

## 2018-07-07 ENCOUNTER — Encounter (HOSPITAL_COMMUNITY): Payer: Self-pay | Admitting: Emergency Medicine

## 2018-07-07 ENCOUNTER — Ambulatory Visit (HOSPITAL_COMMUNITY)
Admission: EM | Admit: 2018-07-07 | Discharge: 2018-07-07 | Disposition: A | Discharge status: Home / Self Care | Payer: Self-pay | Attending: Internal Medicine | Admitting: Internal Medicine

## 2018-07-07 DIAGNOSIS — R102 Pelvic and perineal pain: Secondary | ICD-10-CM

## 2018-07-07 DIAGNOSIS — R1013 Epigastric pain: Secondary | ICD-10-CM

## 2018-07-07 LAB — POCT URINALYSIS DIP (DEVICE)
Bilirubin Urine: NEGATIVE
Glucose, UA: NEGATIVE mg/dL
Hgb urine dipstick: NEGATIVE
Ketones, ur: NEGATIVE mg/dL
Nitrite: NEGATIVE
Protein, ur: NEGATIVE mg/dL
Specific Gravity, Urine: >=1.03 (ref 1.005–1.030)
Urobilinogen, UA: 0.2 mg/dL (ref 0.0–1.0)
pH: 6.5 (ref 5.0–8.0)

## 2018-07-07 LAB — POCT PREGNANCY, URINE: Preg Test, Ur: NEGATIVE

## 2018-07-07 MED ORDER — PANTOPRAZOLE SODIUM 20 MG PO TBEC: 20.0000 mg | DELAYED_RELEASE_TABLET | Freq: Every day | ORAL | 0 refills | Status: DC

## 2018-07-07 NOTE — ED Provider Notes (Signed)
[facility]    CSN: [id number] Arrival date & time: 07/07/18  1310     History   Chief Complaint Chief Complaint  Patient presents with  . Abdominal Pain  . Vaginal Pain    HPI Molly Evans is a 18 y.o. female with no past medical history comes to urgent with abdominal pain for the past 7 days.   Pain is in the epigastric region, of moderate severity.  Pain is more pronounced in the early hours of the morning and when patient wakes up.  She denies any relieving factors.  Is associated with nausea but no vomiting.  Patient has some bloating of the abdomen.  Denies any history of constipation.  Patient also has some vaginal pain during sexual intercourse.  He has mild odor especially after intercourse.  No significant vaginal discharge.  HPI  History reviewed. No pertinent past medical history.  Patient Active Problem List   Diagnosis Date Noted  . Chlamydia infection 04/20/2017    History reviewed. No pertinent surgical history.  OB History   No obstetric history on file.      Home Medications    Prior to Admission medications   Medication Sig Start Date End Date Taking? Authorizing Provider  pantoprazole (PROTONIX) 20 MG tablet Take 1 tablet (20 mg total) by mouth daily. 07/07/18   Lopez Dentinger, Britta MccreedyPhilip O, MD  cetirizine (ZYRTEC) 10 MG tablet Take 1 tablet (10 mg total) by mouth 2 (two) times daily. Patient not taking: Reported on 03/24/2018 03/10/18 07/07/18  Eustace MooreNelson, Yvonne Sue, MD    Family History Family History  Problem Relation Age of Onset  . Hypertension Mother   . Heart disease Maternal Uncle   . Cancer Neg Hx   . Diabetes Neg Hx     Social History Social History   Tobacco Use  . Smoking status: Current Every Day Smoker    Types: Cigars  . Smokeless tobacco: Never Used  Substance Use Topics  . Alcohol use: No  . Drug use: No     Allergies   Patient has no known allergies.   Review of Systems Review of Systems  Constitutional:  Negative.   HENT: Negative.   Respiratory: Negative.   Gastrointestinal: Positive for abdominal pain and nausea. Negative for abdominal distention, blood in stool, constipation, diarrhea and vomiting.  Genitourinary: Positive for pelvic pain (during intercourse) and vaginal pain (during intercourse). Negative for dysuria, enuresis, frequency, vaginal bleeding and vaginal discharge.  Musculoskeletal: Negative.   Skin: Negative.   Neurological: Negative for dizziness, weakness, light-headedness and headaches.     Physical Exam Triage Vital Signs ED Triage Vitals  Enc Vitals Group     BP 07/07/18 1355 106/71     Pulse Rate 07/07/18 1355 74     Resp 07/07/18 1355 12     Temp 07/07/18 1355 98.7 F (37.1 C)     Temp Source 07/07/18 1355 Oral     SpO2 07/07/18 1355 99 %     Weight --      Height --      Head Circumference --      Peak Flow --      Pain Score 07/07/18 1353 0     Pain Loc --      Pain Edu? --      Excl. in GC? --    No data found.  Updated Vital Signs BP 106/71 (BP Location: Left Arm)   Pulse 74   Temp 98.7 F (37.1 C) (  Oral)   Resp 12   LMP 06/06/2018 (Approximate)   SpO2 99%   Visual Acuity Right Eye Distance:   Left Eye Distance:   Bilateral Distance:    Right Eye Near:   Left Eye Near:    Bilateral Near:     Physical Exam Constitutional:      General: She is not in acute distress.    Appearance: She is well-developed. She is not ill-appearing or toxic-appearing.  Cardiovascular:     Heart sounds: No murmur. No friction rub.  Pulmonary:     Effort: Pulmonary effort is normal.     Breath sounds: Normal breath sounds.  Abdominal:     General: Abdomen is flat. Bowel sounds are normal. There is no distension or abdominal bruit. There are no signs of injury.     Palpations: Abdomen is soft. There is no shifting dullness, hepatomegaly or mass.     Tenderness: There is no abdominal tenderness.     Hernia: No hernia is present.  Skin:    Capillary  Refill: Capillary refill takes less than 2 seconds.  Neurological:     Mental Status: She is alert.      UC Treatments / Results  Labs (all labs ordered are listed, but only abnormal results are displayed) Labs Reviewed  POCT URINALYSIS DIP (DEVICE) - Abnormal; Notable for the following components:      Result Value   Leukocytes,Ua TRACE (*)    All other components within normal limits  POCT PREGNANCY, URINE    EKG None  Radiology No results found.  Procedures Procedures (including critical care time)  Medications Ordered in UC Medications - No data to display  Initial Impression / Assessment and Plan / UC Course  I have reviewed the triage vital signs and the nursing notes.  Pertinent labs & imaging results that were available during my care of the patient were reviewed by me and considered in my medical decision making (see chart for details).     1.  Epigastric pain: Protonix 20 mg orally daily If patient has no improvement in 1 month, GI follow-up may be necessary.  2.  Vaginal pain during sexual intercourse: No further work-up needed Patient is advised to try lubricant use. Final Clinical Impressions(s) / UC Diagnoses   Final diagnoses:  Abdominal pain, epigastric   Discharge Instructions   None    ED Prescriptions    Medication Sig Dispense Auth. Provider   pantoprazole (PROTONIX) 20 MG tablet Take 1 tablet (20 mg total) by mouth daily. 30 tablet Nataleah Scioneaux, Myrene Galas, MD     Controlled Substance Prescriptions [place] Controlled Substance Registry consulted? No   Chase Picket, MD 07/08/18 [phone]

## 2018-07-07 NOTE — ED Triage Notes (Signed)
Pt reports tightness in her abdomen for the last week with associated nausea.  Pt also reports pain in her vagina with sexual intercourse and some mild odor with sex as well.

## 2018-07-27 ENCOUNTER — Other Ambulatory Visit: Payer: Self-pay

## 2018-07-27 ENCOUNTER — Ambulatory Visit (HOSPITAL_COMMUNITY)
Admission: EM | Admit: 2018-07-27 | Discharge: 2018-07-27 | Disposition: A | Discharge status: Home / Self Care | Payer: Medicaid Other | Attending: Urgent Care | Admitting: Urgent Care

## 2018-07-27 ENCOUNTER — Encounter (HOSPITAL_COMMUNITY): Payer: Self-pay | Admitting: Emergency Medicine

## 2018-07-27 DIAGNOSIS — R0981 Nasal congestion: Secondary | ICD-10-CM | POA: Diagnosis present

## 2018-07-27 DIAGNOSIS — R070 Pain in throat: Secondary | ICD-10-CM | POA: Diagnosis not present

## 2018-07-27 DIAGNOSIS — Z720 Tobacco use: Secondary | ICD-10-CM | POA: Diagnosis present

## 2018-07-27 DIAGNOSIS — F1290 Cannabis use, unspecified, uncomplicated: Secondary | ICD-10-CM | POA: Diagnosis present

## 2018-07-27 LAB — POCT RAPID STREP A: Streptococcus, Group A Screen (Direct): NEGATIVE

## 2018-07-27 NOTE — ED Provider Notes (Signed)
  MRN: [id number] DOB: [birth year]  Subjective:   Molly Evans is a 18 y.o. female presenting for 4-day history of mild persistent throat pain, scratchy throat.  Has also had intermittent sinus congestion has tried Alka-seltzer, NyQuil. Patient is a smoker and smokes marijuana.  Does not take allergy medications.   No Known Allergies  Denies chronic conditions and past surgical history.  Review of Systems  Constitutional: Negative for fever and malaise/fatigue.  HENT: Negative for ear pain and sinus pain.   Eyes: Negative for blurred vision, double vision, discharge and redness.  Respiratory: Negative for cough, hemoptysis, shortness of breath and wheezing.   Cardiovascular: Negative for chest pain.  Gastrointestinal: Negative for abdominal pain, diarrhea, nausea and vomiting.  Genitourinary: Negative for dysuria, flank pain and hematuria.  Musculoskeletal: Negative for myalgias.  Skin: Negative for rash.  Neurological: Negative for dizziness, weakness and headaches.  Psychiatric/Behavioral: Negative for depression and substance abuse.    Objective:   Vitals: BP (!) 93/53 (BP Location: Left Arm)   Pulse 69   Temp 98.7 F (37.1 C) (Oral)   Resp 16   LMP 07/07/2018   SpO2 100%   Physical Exam Constitutional:      General: She is not in acute distress.    Appearance: Normal appearance. She is well-developed. She is not ill-appearing.  HENT:     Head: Normocephalic and atraumatic.     Nose: Nose normal.     Mouth/Throat:     Mouth: Mucous membranes are moist.     Pharynx: Oropharynx is clear. No oropharyngeal exudate or posterior oropharyngeal erythema.     Tonsils: No tonsillar exudate or tonsillar abscesses.  Eyes:     General: No scleral icterus.    Extraocular Movements: Extraocular movements intact.     Pupils: Pupils are equal, round, and reactive to light.  Cardiovascular:     Rate and Rhythm: Normal rate.  Pulmonary:     Effort: Pulmonary effort is normal.   Skin:    General: Skin is warm and dry.  Neurological:     General: No focal deficit present.     Mental Status: She is alert and oriented to person, place, and time.  Psychiatric:        Mood and Affect: Mood normal.        Behavior: Behavior normal.     Negative rapid strep test by verbal report.  Assessment and Plan :   1. Throat pain   2. Sinus congestion   3. Tobacco user   4. Marijuana user     She refused COVID-19 testing. Counseled patient on nature of COVID-19 including modes of transmission, diagnostic testing, management and supportive care.  Advised supportive care, offered symptomatic relief. Counseled patient on potential for adverse effects with medications prescribed/recommended today, ER and return-to-clinic precautions discussed, patient verbalized understanding.     Jaynee Eagles, PA-C 07/27/18 1340

## 2018-07-27 NOTE — Discharge Instructions (Addendum)
For sore throat or cough try using a honey-based tea. Use 3 teaspoons of honey with juice squeezed from half lemon. Place shaved pieces of ginger into 1/2-1 cup of water and warm over stove top. Then mix the ingredients and repeat every 4 hours as needed. Please take ibuprofen 400mg  every 6 hours alternating with OR taken together with Tylenol 500mg  every 6 hours. Hydrate very well with at least 2 liters of water. Eat light meals such as soups to replenish electrolytes and soft fruits, veggies. Start an antihistamine like Zyrtec, Allegra or Claritin for postnasal drainage, sinus congestion.  You can take this together with pseudoephedrine (Sudafed) at a dose of 60 mg 3 times a day oral 120 mg twice daily as needed for the same kind of congestion.  However, do not take Sudafed if you have high blood pressure or are prone to palpitations, have abnormal heart rhythms.

## 2018-07-27 NOTE — ED Triage Notes (Addendum)
Sore throat since Sunday.  Sore throat is still scratchy.  Denies fever.  Patient thinks this is from sleeping in front of a fan

## 2018-07-29 LAB — CULTURE, GROUP A STREP (THRC)

## 2018-10-10 ENCOUNTER — Ambulatory Visit (INDEPENDENT_AMBULATORY_CARE_PROVIDER_SITE_OTHER): Payer: Medicaid Other | Admitting: Primary Care

## 2018-10-10 ENCOUNTER — Encounter (INDEPENDENT_AMBULATORY_CARE_PROVIDER_SITE_OTHER): Payer: Self-pay | Admitting: Primary Care

## 2018-10-10 ENCOUNTER — Other Ambulatory Visit: Payer: Self-pay

## 2018-10-10 DIAGNOSIS — N921 Excessive and frequent menstruation with irregular cycle: Secondary | ICD-10-CM

## 2018-10-10 DIAGNOSIS — L258 Unspecified contact dermatitis due to other agents: Secondary | ICD-10-CM | POA: Diagnosis not present

## 2018-10-10 DIAGNOSIS — Z3009 Encounter for other general counseling and advice on contraception: Secondary | ICD-10-CM | POA: Diagnosis not present

## 2018-10-10 DIAGNOSIS — L259 Unspecified contact dermatitis, unspecified cause: Secondary | ICD-10-CM

## 2018-10-10 NOTE — Progress Notes (Signed)
Pt unable to wear deodorant, causes itching

## 2018-10-10 NOTE — Progress Notes (Signed)
Virtual Visit via Telephone Note  I connected with Molly Evans on 10/10/18 at 11:10 AM EDT by telephone and verified that I am speaking with the correct person using two identifiers.   I discussed the limitations, risks, security and privacy concerns of performing an evaluation and management service by telephone and the availability of in person appointments. I also discussed with the patient that there may be a patient responsible charge related to this service. The patient expressed understanding and agreed to proceed.   History of Present Illness: [name]. Herbel is establishing care with a new provider however she is established patient at our [facility].  She is being seen via tele visit for concerns of abnormal menstrual cycle and she developed a rash under her arms when using deodorant.  After discussing patient's menstrual cycle pattern she really was not having 2 cycles in 1 month. Month her cycle started on the 21st of the month the next menstrual cycle started on the first of the month varying from 21 to 28 days.  Observations/Objective: Review of Systems  Skin: Positive for itching and rash.       Under arms  All other systems reviewed and are negative.   Assessment and Plan: Kitana was seen today for rash and menstrual problem.  Diagnoses and all orders for this visit:  Contact dermatitis and eczema Contact dermatitis is hypersensitivity reaction to a substance causing cellular immunity response. This may cause papules, vesicles, bullae with surrounding erythema and pruritic .Conservative treatment luke warm baths, Aveeno oatmeal bath or emollients. Medications can be discussed at this visit- antihistamine or pruritic medications. Causes may be contributed to changes in soaps, deodorants, laundry  detergent or environmental- grass, weeds and trees.  Menorrhagia with irregular cycle Menorrhalgia heavy or prolonged vaginal bleeding with  menstrual cycles.  This may be related to hormonal problems, problems with the uterus or other health conditions.  Menstrual cycles can be normal or irregular.  After discussing how her menstrual cycles are following they were depending on 21 days to 28 there is not a irregular cycle, onset of premenstrual cramping may use over-the-counter ibuprofen and can continue to menstrual cycle stops.  Birth control counseling Reviewed all forms of birth control options available including abstinence; over the counter/barrier methods; hormonal contraceptive medication including pill, patch, ring, injection,contraceptive implant; hormonal and nonhormonal IUDs; permanent sterilization options including vasectomy and the various tubal sterilization modalities. Risks and benefits reviewed. Questions were answered.  Information was given to patient to review.  Patient has agreed to have a follow-up in person visit to further discuss birth control options and treatment plan.    Follow Up Instructions:    I discussed the assessment and treatment plan with the patient. The patient was provided an opportunity to ask questions and all were answered. The patient agreed with the plan and demonstrated an understanding of the instructions.   The patient was advised to call back or seek an in-person evaluation if the symptoms worsen or if the condition fails to improve as anticipated.  I provided 19 minutes of non-face-to-face time during this encounter.   [name], NP

## 2018-10-19 ENCOUNTER — Ambulatory Visit (INDEPENDENT_AMBULATORY_CARE_PROVIDER_SITE_OTHER): Payer: Medicaid Other | Admitting: Primary Care

## 2018-10-25 ENCOUNTER — Encounter (INDEPENDENT_AMBULATORY_CARE_PROVIDER_SITE_OTHER): Payer: Self-pay | Admitting: Primary Care

## 2018-10-25 ENCOUNTER — Other Ambulatory Visit (HOSPITAL_COMMUNITY)
Admission: RE | Admit: 2018-10-25 | Discharge: 2018-10-25 | Disposition: A | Discharge status: Home / Self Care | Payer: Medicaid Other | Source: Ambulatory Visit | Attending: Primary Care | Admitting: Primary Care

## 2018-10-25 ENCOUNTER — Ambulatory Visit (INDEPENDENT_AMBULATORY_CARE_PROVIDER_SITE_OTHER): Payer: Medicaid Other | Admitting: Primary Care

## 2018-10-25 ENCOUNTER — Other Ambulatory Visit: Payer: Self-pay

## 2018-10-25 VITALS — BP 126/77 | HR 77 | Temp 98.0°F | Ht 63.0 in | Wt 114.2 lb

## 2018-10-25 DIAGNOSIS — Z708 Other sex counseling: Secondary | ICD-10-CM | POA: Diagnosis not present

## 2018-10-25 DIAGNOSIS — N898 Other specified noninflammatory disorders of vagina: Secondary | ICD-10-CM | POA: Diagnosis not present

## 2018-10-25 DIAGNOSIS — R5383 Other fatigue: Secondary | ICD-10-CM

## 2018-10-25 DIAGNOSIS — N912 Amenorrhea, unspecified: Secondary | ICD-10-CM

## 2018-10-25 DIAGNOSIS — N925 Other specified irregular menstruation: Secondary | ICD-10-CM

## 2018-10-25 MED ORDER — IBUPROFEN 800 MG PO TABS: 600.0000 mg | ORAL_TABLET | Freq: Three times a day (TID) | ORAL | 1 refills | Status: DC | PRN

## 2018-10-25 NOTE — Progress Notes (Signed)
Established Patient Office Visit  Subjective:  Patient ID: Molly Evans, female    DOB: 11-Jun-2000  Age: 18 y.o. MRN: [id number]  CC:  Chief Complaint  Patient presents with  . Menstrual Problem    HPI Molly Evans presents for concern of irregular menstrual cycle since July. Discussed att previous visit. She also reports vaginal discharge with an odor. She reports frequency of periods changes with some at the beginning of the month and some at the end. She reports cramping that is not relieved with ibuprofen, 200mg  5 times. She also says she vomited during her last cycle. She reports being cold often and fatigue by sleeping all of the time.   No past medical history on file.  No past surgical history on file.  Family History  Problem Relation Age of Onset  . Hypertension Mother   . Heart disease Maternal Uncle   . Cancer Neg Hx   . Diabetes Neg Hx     Social History   Socioeconomic History  . Marital status: Single    Spouse name: Not on file  . Number of children: Not on file  . Years of education: Not on file  . Highest education level: Not on file  Occupational History  . Not on file  Social Needs  . Financial resource strain: Not on file  . Food insecurity    Worry: Not on file    Inability: Not on file  . Transportation needs    Medical: Not on file    Non-medical: Not on file  Tobacco Use  . Smoking status: Current Every Day Smoker    Types: Cigars  . Smokeless tobacco: Never Used  Substance and Sexual Activity  . Alcohol use: No  . Drug use: No  . Sexual activity: Yes    Partners: Male    Birth control/protection: Condom  Lifestyle  . Physical activity    Days per week: Not on file    Minutes per session: Not on file  . Stress: Not on file  Relationships  . Social on phone: Not on file    Gets together: Not on file    Attends religious service: Not on file    Active member of club or organization: Not on file     Attends meetings of clubs or organizations: Not on file    Relationship status: Not on file  . Intimate partner violence    Fear of current or ex partner: Not on file    Emotionally abused: Not on file    Physically abused: Not on file    Forced sexual activity: Not on file  Other Topics Concern  . Not on file  Social History Narrative   8th grade, desiring to go out for track 2014, does hair styling, exercise - running    No outpatient medications prior to visit.   No facility-administered medications prior to visit.     No Known Allergies  ROS Review of Systems  Constitutional: Positive for chills and fatigue.       Feels cold all of the time.  Gastrointestinal: Positive for abdominal pain and nausea.       Due to menstrual cycle. Nausea with onset of cycles.   Genitourinary: Positive for menstrual problem and vaginal discharge.      Objective:    Physical Exam  Constitutional: She is oriented to person, place, and time. She appears well-developed and well-nourished.  HENT:  Head: Normocephalic.  Neck: Normal range of motion. Neck supple.  Cardiovascular: Normal rate and regular rhythm.  Pulmonary/Chest: Effort normal and breath sounds normal.  Abdominal: Soft. Bowel sounds are normal.  Musculoskeletal: Normal range of motion.  Neurological: She is alert and oriented to person, place, and time.  Skin: Skin is warm.    BP 126/77 (BP Location: Right Arm, Patient Position: Sitting, Cuff Size: Small)   Pulse 77   Temp 98 F (36.7 C) (Temporal)   Ht 5\' 3"  (1.6 m)   Wt 114 lb 3.2 oz (51.8 kg)   LMP 10/08/2018 (Approximate)   SpO2 98%   BMI 20.23 kg/m  Wt Readings from Last 3 Encounters:  10/25/18 114 lb 3.2 oz (51.8 kg) (22 %, Z= -0.76)*  03/24/18 123 lb (55.8 kg) (42 %, Z= -0.20)*  02/20/18 125 lb 6.4 oz (56.9 kg) (47 %, Z= -0.07)*   * Growth percentiles are based on CDC (Girls, 2-20 Years) data.     There are no preventive care reminders to display  for this patient.  There are no preventive care reminders to display for this patient.  No results found for: TSH Lab Results  Component Value Date   WBC 6.4 05/23/2018   HGB 12.7 05/23/2018   HCT 38.8 05/23/2018   MCV 82.4 05/23/2018   PLT 240 05/23/2018   Lab Results  Component Value Date   NA 140 05/23/2018   K 3.8 05/23/2018   CO2 25 05/23/2018   GLUCOSE 87 05/23/2018   BUN 6 05/23/2018   CREATININE 0.88 05/23/2018   CALCIUM 8.8 (L) 05/23/2018   ANIONGAP 6 05/23/2018   No results found for: CHOL No results found for: HDL No results found for: LDLCALC No results found for: TRIG No results found for: CHOLHDL Lab Results  Component Value Date   HGBA1C 5.3 10/24/2017      Assessment & Plan:  Molly Evans was seen today for menstrual problem.  Diagnoses and all orders for this visit:  Vaginal discharge Odorous and dark yellow discharge does not itch. -     Cervicovaginal ancillary only  Amenorrhea Explained the absence of menstrual cycle for at least 3 cycles explained on the last visit she was having fluctuation from 21-28 days. - pregnancy test was negative.  Fatigue, unspecified type Increased sleeping and fatigue feeling cold all the time rule out causative factors anemia and thyroid disease. -     TSH + free T4 -     CBC with Differential  Sexually transmitted disease counseling Uses condoms infrequently. Discussed again  regarding condom use with each sexual activity to promote wellness and prevention of transmission of HIV, syphilis, herpes simplex virus, gonorrhea, chlamydia and trichomoniasis..    Other orders -     ibuprofen (ADVIL) 800 MG tablet; Take 1 tablet (800 mg total) by mouth every 8 (eight) hours as needed for moderate pain or cramping.    No orders of the defined types were placed in this encounter.   Follow-up: No follow-ups on file.    [name], NP

## 2018-10-25 NOTE — Patient Instructions (Signed)

## 2018-10-26 ENCOUNTER — Telehealth (INDEPENDENT_AMBULATORY_CARE_PROVIDER_SITE_OTHER): Payer: Self-pay

## 2018-10-26 LAB — TSH+FREE T4
Free T4: 1.25 ng/dL (ref 0.93–1.60)
TSH: 1.08 u[IU]/mL (ref 0.450–4.500)

## 2018-10-26 LAB — CBC WITH DIFFERENTIAL/PLATELET
Basophils Absolute: 0 10*3/uL (ref 0.0–0.2)
Basos: 1 %
EOS (ABSOLUTE): 0.1 10*3/uL (ref 0.0–0.4)
Eos: 3 %
Hematocrit: 43.1 % (ref 34.0–46.6)
Hemoglobin: 14.1 g/dL (ref 11.1–15.9)
Immature Grans (Abs): 0 10*3/uL (ref 0.0–0.1)
Immature Granulocytes: 0 %
Lymphocytes Absolute: 1.8 10*3/uL (ref 0.7–3.1)
Lymphs: 45 %
MCH: 27.2 pg (ref 26.6–33.0)
MCHC: 32.7 g/dL (ref 31.5–35.7)
MCV: 83 fL (ref 79–97)
Monocytes Absolute: 0.4 10*3/uL (ref 0.1–0.9)
Monocytes: 9 %
Neutrophils Absolute: 1.7 10*3/uL (ref 1.4–7.0)
Neutrophils: 42 %
Platelets: 246 10*3/uL (ref 150–450)
RBC: 5.19 x10E6/uL (ref 3.77–5.28)
RDW: 13 % (ref 11.7–15.4)
WBC: 4 10*3/uL (ref 3.4–10.8)

## 2018-10-26 NOTE — Telephone Encounter (Signed)
Patient is aware that all labs are normal and she does not have a thyroid problem. Nat Christen, CMA

## 2018-10-26 NOTE — Telephone Encounter (Signed)
-----   Message from [name], NP sent at 10/26/2018  9:05 AM EDT ----- I have reviewed all labs and they are normal/unremarkable. No thyroid problems

## 2018-11-02 LAB — CERVICOVAGINAL ANCILLARY ONLY
Bacterial Vaginitis (gardnerella): POSITIVE — AB
Candida Glabrata: NEGATIVE
Candida Vaginitis: POSITIVE — AB
Chlamydia: NEGATIVE
Comment: NEGATIVE
Comment: NEGATIVE
Comment: NEGATIVE
Comment: NEGATIVE
Comment: NEGATIVE
Comment: NORMAL
Neisseria Gonorrhea: NEGATIVE
Trichomonas: NEGATIVE

## 2018-11-06 ENCOUNTER — Other Ambulatory Visit (INDEPENDENT_AMBULATORY_CARE_PROVIDER_SITE_OTHER): Payer: Self-pay | Admitting: Primary Care

## 2018-11-06 MED ORDER — FLUCONAZOLE 150 MG PO TABS: 150.0000 mg | ORAL_TABLET | Freq: Once | ORAL | 0 refills | Status: AC

## 2018-11-06 MED ORDER — METRONIDAZOLE 500 MG PO TABS: 500.0000 mg | ORAL_TABLET | Freq: Two times a day (BID) | ORAL | 0 refills | Status: DC

## 2018-11-08 ENCOUNTER — Encounter (INDEPENDENT_AMBULATORY_CARE_PROVIDER_SITE_OTHER): Payer: Self-pay

## 2019-01-08 ENCOUNTER — Ambulatory Visit (INDEPENDENT_AMBULATORY_CARE_PROVIDER_SITE_OTHER): Payer: Medicaid Other | Admitting: Primary Care

## 2019-01-19 NOTE — L&D Delivery Note (Addendum)
[facility] Delivery Note  Molly Evans is a 19 y.o. G1P0 s/p SVD at [redacted]w[redacted]d. She was admitted for non-reactive NST and postdates.   ROM: 8h 83m with clear fluid GBS Status: Negative Maximum Maternal Temperature: 98.6  Labor Progress: Patient presented to MAU with regular contractions, initial cervical check 3.5/80/-1. Nonreactive NST but strip otherwise reassuring.  Rapid COVID-19 swab positive, patient asymptomatic. SROM 1502. Noted to be complete at 2123 without augmentation. Continued to labor down until 2300 when provider was called for delivery.  Delivery Date/Time: 08/30/19 2311 Delivery: Called to room and patient was complete and pushing. Appropriate PPI worn for COVID-19 positive patient. Head delivered ROA. No nuchal cord present. Shoulder and body delivered in usual fashion. Infant with spontaneous cry, placed on mother's abdomen, dried and stimulated. Cord clamped x 2 after 1-minute delay, and cut by FOB under my direct supervision. Cord blood drawn. Placenta delivered spontaneously with gentle cord traction. Fundus firm with massage and Pitocin. Labia, perineum, vagina, and cervix were inspected. Left and right labial abrasions noted to be hemostatic with pressure, did not require repair. Instrument count performed and confirmed correct.    Placenta: Intact, 3 vessel cord, delivered spontaneously Complications: None Lacerations: Left and right labial abrasion, hemostatic without repair EBL: 300 mL Analgesia: Epidural  Infant: Female   APGARs 8 and 9   Weight pending   Nelda Bucks, MD [facility] PGY-3   Midwife attestation: I was gloved and present for delivery in its entirety and I agree with the above resident's note.  Sharen Counter, CNM 2:29 AM

## 2019-02-13 ENCOUNTER — Other Ambulatory Visit: Payer: Self-pay

## 2019-02-13 ENCOUNTER — Encounter: Payer: Self-pay | Admitting: Family Medicine

## 2019-02-13 ENCOUNTER — Ambulatory Visit (INDEPENDENT_AMBULATORY_CARE_PROVIDER_SITE_OTHER): Payer: Medicaid Other | Admitting: Family Medicine

## 2019-02-13 ENCOUNTER — Other Ambulatory Visit (HOSPITAL_COMMUNITY)
Admission: RE | Admit: 2019-02-13 | Discharge: 2019-02-13 | Disposition: A | Discharge status: Home / Self Care | Payer: Medicaid Other | Source: Ambulatory Visit | Attending: Family Medicine | Admitting: Family Medicine

## 2019-02-13 VITALS — BP 116/70 | HR 76 | Wt 116.0 lb

## 2019-02-13 DIAGNOSIS — Z3A01 Less than 8 weeks gestation of pregnancy: Secondary | ICD-10-CM

## 2019-02-13 DIAGNOSIS — Z3400 Encounter for supervision of normal first pregnancy, unspecified trimester: Secondary | ICD-10-CM | POA: Diagnosis not present

## 2019-02-13 DIAGNOSIS — Z315 Encounter for genetic counseling: Secondary | ICD-10-CM | POA: Diagnosis not present

## 2019-02-13 DIAGNOSIS — Z3482 Encounter for supervision of other normal pregnancy, second trimester: Secondary | ICD-10-CM | POA: Diagnosis not present

## 2019-02-13 DIAGNOSIS — Z3490 Encounter for supervision of normal pregnancy, unspecified, unspecified trimester: Secondary | ICD-10-CM

## 2019-02-13 DIAGNOSIS — Z3401 Encounter for supervision of normal first pregnancy, first trimester: Secondary | ICD-10-CM

## 2019-02-13 MED ORDER — BLOOD PRESSURE CUFF MISC: 1.0000 | 0 refills | Status: DC

## 2019-02-13 NOTE — Patient Instructions (Signed)
Eating Plan for Pregnant Women While you are pregnant, your body requires additional nutrition to help support your growing baby. You also have a higher need for some vitamins and minerals, such as folic acid, calcium, iron, and vitamin D. Eating a healthy, well-balanced diet is very important for your health and your baby's health. Your need for extra calories varies for the three 3-month segments of your pregnancy (trimesters). For most women, it is recommended to consume:  150 extra calories a day during the first trimester.  300 extra calories a day during the second trimester.  300 extra calories a day during the third trimester. What are tips for following this plan?   Do not try to lose weight or go on a diet during pregnancy.  Limit your overall intake of foods that have "empty calories." These are foods that have little nutritional value, such as sweets, desserts, candies, and sugar-sweetened beverages.  Eat a variety of foods (especially fruits and vegetables) to get a full range of vitamins and minerals.  Take a prenatal vitamin to help meet your additional vitamin and mineral needs during pregnancy, specifically for folic acid, iron, calcium, and vitamin D.  Remember to stay active. Ask your health care provider what types of exercise and activities are safe for you.  Practice good food safety and cleanliness. Wash your hands before you eat and after you prepare raw meat. Wash all fruits and vegetables well before peeling or eating. Taking these actions can help to prevent food-borne illnesses that can be very dangerous to your baby, such as listeriosis. Ask your health care provider for more information about listeriosis. What does 150 extra calories look like? Healthy options that provide 150 extra calories each day could be any of the following:  6-8 oz (170-230 g) of plain low-fat yogurt with  cup of berries.  1 apple with 2 teaspoons (11 g) of peanut butter.  Cut-up  vegetables with  cup (60 g) of hummus.  8 oz (230 mL) or 1 cup of low-fat chocolate milk.  1 stick of string cheese with 1 medium orange.  1 peanut butter and jelly sandwich that is made with one slice of whole-wheat bread and 1 tsp (5 g) of peanut butter. For 300 extra calories, you could eat two of those healthy options each day. What is a healthy amount of weight to gain? The right amount of weight gain for you is based on your BMI before you became pregnant. If your BMI:  Was less than 18 (underweight), you should gain 28-40 lb (13-18 kg).  Was 18-24.9 (normal), you should gain 25-35 lb (11-16 kg).  Was 25-29.9 (overweight), you should gain 15-25 lb (7-11 kg).  Was 30 or greater (obese), you should gain 11-20 lb (5-9 kg). What if I am having twins or multiples? Generally, if you are carrying twins or multiples:  You may need to eat 300-600 extra calories a day.  The recommended range for total weight gain is 25-54 lb (11-25 kg), depending on your BMI before pregnancy.  Talk with your health care provider to find out about nutritional needs, weight gain, and exercise that is right for you. What foods can I eat?  Fruits All fruits. Eat a variety of colors and types of fruit. Remember to wash your fruits well before peeling or eating. Vegetables All vegetables. Eat a variety of colors and types of vegetables. Remember to wash your vegetables well before peeling or eating. Grains All grains. Choose whole grains, such   as whole-wheat bread, oatmeal, or brown rice. Meats and other protein foods Lean meats, including chicken, turkey, fish, and lean cuts of beef, veal, or pork. If you eat fish or seafood, choose options that are higher in omega-3 fatty acids and lower in mercury, such as salmon, herring, mussels, trout, sardines, pollock, shrimp, crab, and lobster. Tofu. Tempeh. Beans. Eggs. Peanut butter and other nut butters. Make sure that all meats, poultry, and eggs are cooked to  food-safe temperatures or "well-done." Two or more servings of fish are recommended each week in order to get the most benefits from omega-3 fatty acids that are found in seafood. Choose fish that are lower in mercury. You can find more information online:  www.fda.gov Dairy Pasteurized milk and milk alternatives (such as almond milk). Pasteurized yogurt and pasteurized cheese. Cottage cheese. Sour cream. Beverages Water. Juices that contain 100% fruit juice or vegetable juice. Caffeine-free teas and decaffeinated coffee. Drinks that contain caffeine are okay to drink, but it is better to avoid caffeine. Keep your total caffeine intake to less than 200 mg each day (which is 12 oz or 355 mL of coffee, tea, or soda) or the limit as told by your health care provider. Fats and oils Fats and oils are okay to include in moderation. Sweets and desserts Sweets and desserts are okay to include in moderation. Seasoning and other foods All pasteurized condiments. The items listed above may not be a complete list of foods and beverages you can eat. Contact a dietitian for more information. What foods are not recommended? Fruits Unpasteurized fruit juices. Vegetables Raw (unpasteurized) vegetable juices. Meats and other protein foods Lunch meats, bologna, hot dogs, or other deli meats. (If you must eat those meats, reheat them until they are steaming hot.) Refrigerated pat, meat spreads from a meat counter, smoked seafood that is found in the refrigerated section of a store. Raw or undercooked meats, poultry, and eggs. Raw fish, such as sushi or sashimi. Fish that have high mercury content, such as tilefish, shark, swordfish, and king mackerel. To learn more about mercury in fish, talk with your health care provider or look for online resources, such as:  www.fda.gov Dairy Raw (unpasteurized) milk and any foods that have raw milk in them. Soft cheeses, such as feta, queso blanco, queso fresco, Brie,  Camembert cheeses, blue-veined cheeses, and Panela cheese (unless it is made with pasteurized milk, which must be stated on the label). Beverages Alcohol. Sugar-sweetened beverages, such as sodas, teas, or energy drinks. Seasoning and other foods Homemade fermented foods and drinks, such as pickles, sauerkraut, or kombucha drinks. (Store-bought pasteurized versions of these are okay.) Salads that are made in a store or deli, such as ham salad, chicken salad, egg salad, tuna salad, and seafood salad. The items listed above may not be a complete list of foods and beverages you should avoid. Contact a dietitian for more information. Where to find more information To calculate the number of calories you need based on your height, weight, and activity level, you can use an online calculator such as:  www.choosemyplate.gov/MyPlatePlan To calculate how much weight you should gain during pregnancy, you can use an online pregnancy weight gain calculator such as:  www.choosemyplate.gov/pregnancy-weight-gain-calculator Summary  While you are pregnant, your body requires additional nutrition to help support your growing baby.  Eat a variety of foods, especially fruits and vegetables to get a full range of vitamins and minerals.  Practice good food safety and cleanliness. Wash your hands before you eat   and after you prepare raw meat. Wash all fruits and vegetables well before peeling or eating. Taking these actions can help to prevent food-borne illnesses, such as listeriosis, that can be very dangerous to your baby.  Do not eat raw meat or fish. Do not eat fish that have high mercury content, such as tilefish, shark, swordfish, and king mackerel. Do not eat unpasteurized (raw) dairy.  Take a prenatal vitamin to help meet your additional vitamin and mineral needs during pregnancy, specifically for folic acid, iron, calcium, and vitamin D. This information is not intended to replace advice given to you by  your health care provider. Make sure you discuss any questions you have with your health care provider. Document Revised: 05/25/2018 Document Reviewed: 10/01/2016 Elsevier Patient Education  2020 Elsevier Inc.  

## 2019-02-13 NOTE — Progress Notes (Signed)
un4Subjective:  Molly Evans is a 19 y.o. G1P0 at Unknown being seen today for ongoing prenatal care.  She is currently monitored for the following issues for this low-risk pregnancy and has Chlamydia infection and Supervision of normal pregnancy on their problem list.  Patient reports one episode of fainting and a vaginal odor for 2 weeks. She also is not drinking very much water, nor is she eating much. Contractions: Not present. Vag. Bleeding: None.   . Denies leaking of fluid.   The following portions of the patient's history were reviewed and updated as appropriate: allergies, current medications, past family history, past medical history, past social history, past surgical history and problem list. Problem list updated.  Objective:   Vitals:   02/13/19 1030  BP: 116/70  Pulse: 76  Weight: 116 lb (52.6 kg)    Fetal Status: Fetal Heart Rate (bpm): 155         General:  Alert, oriented and cooperative. Patient is in no acute distress.  Skin: Skin is warm and dry. No rash noted.   Cardiovascular: Normal heart rate noted  Respiratory: Normal respiratory effort, no problems with respiration noted  Abdomen: Soft, gravid, appropriate for gestational age. Pain/Pressure: Absent     Pelvic: Vag. Bleeding: None    No discharge, no CMT. Cervical exam deferred        Extremities: Normal range of motion.  Edema: None  Mental Status: Normal mood and affect. Normal behavior. Normal judgment and thought content.   Urinalysis:      Assessment and Plan:  Pregnancy: G1P0 at Unknown  1. Supervision of normal first pregnancy, antepartum - Oriented to practice - Discussed nutrition during pregnancy - Discussed access to care, [facility], [facility], Entrance C - Cervicovaginal ancillary only( ) - Blood Pressure Monitoring (BLOOD PRESSURE CUFF) MISC; 1 Device by Does not apply route once a week.  Dispense: 1 each; Refill: 0 - Obstetric Panel, Including HIV -  Culture, OB Urine - Genetic Screening - Enroll Patient in Bab[facility]cripts - [place] MFM OB COMP + 14 WK; Future  Preterm labor symptoms and general obstetric precautions including but not limited to vaginal bleeding, contractions, leaking of fluid and fetal movement were reviewed in detail with the patient. Please refer to After Visit Summary for other counseling recommendations.  Return in about 4 weeks (around 03/13/2019) for ROB.   Leo Fray L, DO

## 2019-02-13 NOTE — Progress Notes (Signed)
Pt is here for initial OB visit. LMP 11/20/18.

## 2019-02-14 ENCOUNTER — Other Ambulatory Visit: Payer: Self-pay | Admitting: Family Medicine

## 2019-02-14 DIAGNOSIS — B3731 Acute candidiasis of vulva and vagina: Secondary | ICD-10-CM

## 2019-02-14 DIAGNOSIS — Z3400 Encounter for supervision of normal first pregnancy, unspecified trimester: Secondary | ICD-10-CM | POA: Diagnosis not present

## 2019-02-14 DIAGNOSIS — B373 Candidiasis of vulva and vagina: Secondary | ICD-10-CM

## 2019-02-14 LAB — OBSTETRIC PANEL, INCLUDING HIV
Antibody Screen: NEGATIVE
Basophils Absolute: 0 10*3/uL (ref 0.0–0.2)
Basos: 0 %
EOS (ABSOLUTE): 0 10*3/uL (ref 0.0–0.4)
Eos: 1 %
HIV Screen 4th Generation wRfx: NONREACTIVE
Hematocrit: 43.7 % (ref 34.0–46.6)
Hemoglobin: 13.7 g/dL (ref 11.1–15.9)
Hepatitis B Surface Ag: NEGATIVE
Immature Grans (Abs): 0 10*3/uL (ref 0.0–0.1)
Immature Granulocytes: 0 %
Lymphocytes Absolute: 1.3 10*3/uL (ref 0.7–3.1)
Lymphs: 24 %
MCH: 26.4 pg — ABNORMAL LOW (ref 26.6–33.0)
MCHC: 31.4 g/dL — ABNORMAL LOW (ref 31.5–35.7)
MCV: 84 fL (ref 79–97)
Monocytes Absolute: 0.7 10*3/uL (ref 0.1–0.9)
Monocytes: 13 %
Neutrophils Absolute: 3.3 10*3/uL (ref 1.4–7.0)
Neutrophils: 62 %
Platelets: 259 10*3/uL (ref 150–450)
RBC: 5.18 x10E6/uL (ref 3.77–5.28)
RDW: 13.2 % (ref 11.7–15.4)
RPR Ser Ql: NONREACTIVE
Rh Factor: POSITIVE
Rubella Antibodies, IGG: 2.76 index (ref >0.99)
WBC: 5.4 10*3/uL (ref 3.4–10.8)

## 2019-02-14 LAB — CERVICOVAGINAL ANCILLARY ONLY
Bacterial Vaginitis (gardnerella): POSITIVE — AB
Candida Glabrata: NEGATIVE
Candida Vaginitis: POSITIVE — AB
Chlamydia: NEGATIVE
Comment: NEGATIVE
Comment: NEGATIVE
Comment: NEGATIVE
Comment: NEGATIVE
Comment: NEGATIVE
Comment: NORMAL
Neisseria Gonorrhea: NEGATIVE
Trichomonas: NEGATIVE

## 2019-02-14 MED ORDER — TERCONAZOLE 0.4 % VA CREA: 1.0000 | TOPICAL_CREAM | Freq: Every day | VAGINAL | 0 refills | Status: DC

## 2019-02-14 NOTE — Progress Notes (Signed)
Patient positive for yeast and BV. Script for terazol sent  Marlowe Alt, DO [facility] 02/14/2019 2:24 PM

## 2019-02-15 ENCOUNTER — Other Ambulatory Visit: Payer: Self-pay

## 2019-02-15 DIAGNOSIS — B9689 Other specified bacterial agents as the cause of diseases classified elsewhere: Secondary | ICD-10-CM

## 2019-02-15 DIAGNOSIS — N760 Acute vaginitis: Secondary | ICD-10-CM

## 2019-02-15 MED ORDER — METRONIDAZOLE 500 MG PO TABS: 500.0000 mg | ORAL_TABLET | Freq: Two times a day (BID) | ORAL | 0 refills | Status: DC

## 2019-02-15 NOTE — Progress Notes (Signed)
Rx sent for BV  Pt made aware of results and voiced understanding.

## 2019-02-16 LAB — CULTURE, OB URINE

## 2019-02-16 LAB — URINE CULTURE, OB REFLEX

## 2019-02-21 ENCOUNTER — Encounter: Payer: Self-pay | Admitting: Family Medicine

## 2019-02-28 ENCOUNTER — Encounter (HOSPITAL_COMMUNITY): Payer: Self-pay

## 2019-02-28 ENCOUNTER — Ambulatory Visit (HOSPITAL_COMMUNITY)
Admission: EM | Admit: 2019-02-28 | Discharge: 2019-02-28 | Disposition: A | Discharge status: Home / Self Care | Payer: Medicaid Other | Attending: Family Medicine | Admitting: Family Medicine

## 2019-02-28 ENCOUNTER — Other Ambulatory Visit: Payer: Self-pay

## 2019-02-28 DIAGNOSIS — O26891 Other specified pregnancy related conditions, first trimester: Secondary | ICD-10-CM | POA: Insufficient documentation

## 2019-02-28 DIAGNOSIS — Z3A00 Weeks of gestation of pregnancy not specified: Secondary | ICD-10-CM | POA: Diagnosis not present

## 2019-02-28 DIAGNOSIS — Z87891 Personal history of nicotine dependence: Secondary | ICD-10-CM | POA: Insufficient documentation

## 2019-02-28 DIAGNOSIS — Z3491 Encounter for supervision of normal pregnancy, unspecified, first trimester: Secondary | ICD-10-CM

## 2019-02-28 DIAGNOSIS — R112 Nausea with vomiting, unspecified: Secondary | ICD-10-CM

## 2019-02-28 DIAGNOSIS — Z8249 Family history of ischemic heart disease and other diseases of the circulatory system: Secondary | ICD-10-CM | POA: Diagnosis not present

## 2019-02-28 DIAGNOSIS — Z20822 Contact with and (suspected) exposure to covid-19: Secondary | ICD-10-CM | POA: Insufficient documentation

## 2019-02-28 DIAGNOSIS — R519 Headache, unspecified: Secondary | ICD-10-CM | POA: Insufficient documentation

## 2019-02-28 MED ORDER — ACETAMINOPHEN 325 MG PO TABS
ORAL_TABLET | ORAL | Status: AC
  Filled 2019-02-28: qty 2

## 2019-02-28 MED ORDER — ONDANSETRON 4 MG PO TBDP
4.0000 mg | ORAL_TABLET | Freq: Once | ORAL | Status: AC
  Administered 2019-02-28: 14:00:00 4 mg via ORAL

## 2019-02-28 MED ORDER — VITAMIN B-6 25 MG PO TABS: 25.0000 mg | ORAL_TABLET | Freq: Two times a day (BID) | ORAL | 0 refills | Status: DC | PRN

## 2019-02-28 MED ORDER — ONDANSETRON 4 MG PO TBDP
ORAL_TABLET | ORAL | Status: AC
  Filled 2019-02-28: qty 1

## 2019-02-28 MED ORDER — ACETAMINOPHEN 325 MG PO TABS
650.0000 mg | ORAL_TABLET | Freq: Once | ORAL | Status: AC
  Administered 2019-02-28: 650 mg via ORAL

## 2019-02-28 NOTE — ED Triage Notes (Signed)
Pt reports she was vomiting yesterday; headache x 1 day.

## 2019-02-28 NOTE — Discharge Instructions (Addendum)
You may take 500mg -650mg  Tylenol every 6 hours for headaches, fevers, aches pains.

## 2019-02-28 NOTE — ED Provider Notes (Signed)
[place]   MRN: [id number] DOB: [birth year]  Subjective:   Molly Evans is a 19 y.o. female presenting for an episode of nausea with vomiting yesterday and a headache today.  Patient states that she is currently pregnant, has an OB appointment on the 23rd.  They have already seen her and treated her for BV and yeast infection.  She completed these medications.  Has not taken any medications for her headache today.  Has not had any more nausea or vomiting today.  No current facility-administered medications for this encounter.  Current Outpatient Medications:  .  Blood Pressure Monitoring (BLOOD PRESSURE CUFF) MISC, 1 Device by Does not apply route once a week., Disp: 1 each, Rfl: 0 .  Prenatal Vit-Fe Fumarate-FA (MULTIVITAMIN-PRENATAL) 27-0.8 MG TABS tablet, Take 1 tablet by mouth daily at 12 noon., Disp: , Rfl:    No Known Allergies  Past Medical History:  Diagnosis Date  . Skin condition resolved      History reviewed. No pertinent surgical history.  Family History  Problem Relation Age of Onset  . Hypertension Mother   . Lupus Mother   . Rheum arthritis Sister   . Heart disease Maternal Uncle   . Rheum arthritis Maternal Grandmother   . Cancer Neg Hx   . Diabetes Neg Hx     Social History   Tobacco Use  . Smoking status: Former Smoker    Types: Cigars  . Smokeless tobacco: Never Used  Substance Use Topics  . Alcohol use: Not Currently  . Drug use: No    ROS   Objective:   Vitals: BP 103/71 (BP Location: Right Arm)   Pulse 89   Temp 98.2 F (36.8 C) (Oral)   Resp 16   LMP 11/20/2018 (Exact Date)   SpO2 100%   Physical Exam Constitutional:      General: She is not in acute distress.    Appearance: Normal appearance. She is well-developed. She is not ill-appearing, toxic-appearing or diaphoretic.  HENT:     Head: Normocephalic and atraumatic.     Nose: Nose normal.     Mouth/Throat:     Mouth: Mucous membranes are moist.   Pharynx: Oropharynx is clear.  Eyes:     General: No scleral icterus.    Extraocular Movements: Extraocular movements intact.     Pupils: Pupils are equal, round, and reactive to light.  Cardiovascular:     Rate and Rhythm: Normal rate.  Pulmonary:     Effort: Pulmonary effort is normal.  Skin:    General: Skin is warm and dry.  Neurological:     General: No focal deficit present.     Mental Status: She is alert and oriented to person, place, and time.     Cranial Nerves: No cranial nerve deficit or facial asymmetry.     Sensory: No sensory deficit.     Motor: No weakness.     Coordination: Coordination normal.     Gait: Gait normal.     Deep Tendon Reflexes: Reflexes normal.  Psychiatric:        Mood and Affect: Mood normal. Mood is not anxious or depressed.        Speech: Speech normal.        Behavior: Behavior normal.      Assessment and Plan :   1. Acute nonintractable headache, unspecified headache type   2. Nausea and vomiting, intractability of vomiting not specified, unspecified vomiting type   3. First trimester pregnancy  Offered patient Zofran in the clinic today.  She is to start pyridoxine 25 mg twice daily for nausea and vomiting in pregnancy.  Recommended Tylenol for general headaches, fevers or pains.  She was given 650 mg of Tylenol in clinic today.  Counseled that she is not able to take NSAIDs in pregnancy.  Recommended close follow-up with her OB. Counseled patient on potential for adverse effects with medications prescribed/recommended today, ER and return-to-clinic precautions discussed, patient verbalized understanding.    Wallis Bamberg, [place] 02/28/19 1358

## 2019-03-02 LAB — NOVEL CORONAVIRUS, NAA (HOSP ORDER, SEND-OUT TO REF LAB; TAT 18-24 HRS): SARS-CoV-2, NAA: NOT DETECTED

## 2019-03-13 ENCOUNTER — Ambulatory Visit (INDEPENDENT_AMBULATORY_CARE_PROVIDER_SITE_OTHER): Payer: Medicaid Other | Admitting: Obstetrics and Gynecology

## 2019-03-13 ENCOUNTER — Other Ambulatory Visit: Payer: Self-pay | Admitting: Obstetrics and Gynecology

## 2019-03-13 ENCOUNTER — Other Ambulatory Visit: Payer: Self-pay

## 2019-03-13 ENCOUNTER — Encounter: Payer: Self-pay | Admitting: Obstetrics and Gynecology

## 2019-03-13 VITALS — BP 112/65 | HR 81 | Temp 98.4°F | Wt 120.1 lb

## 2019-03-13 DIAGNOSIS — Z3400 Encounter for supervision of normal first pregnancy, unspecified trimester: Secondary | ICD-10-CM | POA: Diagnosis not present

## 2019-03-13 DIAGNOSIS — O9229 Other disorders of breast associated with pregnancy and the puerperium: Secondary | ICD-10-CM

## 2019-03-13 DIAGNOSIS — N644 Mastodynia: Secondary | ICD-10-CM

## 2019-03-13 DIAGNOSIS — Z3A16 16 weeks gestation of pregnancy: Secondary | ICD-10-CM

## 2019-03-13 NOTE — Patient Instructions (Signed)

## 2019-03-13 NOTE — Progress Notes (Signed)
   LOW-RISK PREGNANCY OFFICE VISIT Patient name: Molly Evans MRN [id number]  Date of birth: 12-21-2000 Chief Complaint:   Routine Prenatal Visit  History of Present Illness:   Molly Evans is a 19 y.o. G1P0 female at [redacted]w[redacted]d with an Estimated Date of Delivery: 08/27/19 being seen today for ongoing management of a low-risk pregnancy.  Today she reports enlarged, painful and very red breasts "for a while". Contractions: Not present. Vag. Bleeding: None.  Movement: Absent. denies leaking of fluid. Review of Systems:   Pertinent items are noted in HPI Denies abnormal vaginal discharge w/ itching/odor/irritation, headaches, visual changes, shortness of breath, chest pain, abdominal pain, severe nausea/vomiting, or problems with urination or bowel movements unless otherwise stated above. Pertinent History Reviewed:  Reviewed past medical,surgical, social, obstetrical and family history.  Reviewed problem list, medications and allergies. Physical Assessment:   Vitals:   03/13/19 1028  BP: 112/65  Pulse: 81  Temp: 98.4 F (36.9 C)  Weight: 120 lb 1.6 oz (54.5 kg)  Body mass index is 21.27 kg/m.        Physical Examination:   General appearance: Well appearing, and in no distress  Mental status: Alert, oriented to person, place, and time  Skin: Warm & dry          Cardiovascular: Normal heart rate noted  Respiratory: Normal respiratory effort, no distress  Abdomen: Soft, gravid, nontender  Pelvic: Cervical exam deferred         Extremities: Edema: None  Fetal Status: Fetal Heart Rate (bpm): 142   Movement: Absent    No results found for this or any previous visit (from the past 24 hour(s)).  Assessment & Plan:  1) Low-risk pregnancy G1P0 at [redacted]w[redacted]d with an Estimated Date of Delivery: 08/27/19   2) Supervision of normal first pregnancy, antepartum  - Plan: AFP, Serum, Open Spina Bifida - Anatomy U/S scheduled for 04/02/2019  Mastalgia during pregnancy  - Plan:  Ambulatory referral to Breast Clinic - Consult with Dr. Debroah Loop -- agrees with plan to refer to Breast Center, does not recommend abx at this time   Meds: No orders of the defined types were placed in this encounter.  Labs/procedures today: AFP  Plan:  Continue routine obstetrical care   Reviewed: Preterm labor symptoms and general obstetric precautions including but not limited to vaginal bleeding, contractions, leaking of fluid and fetal movement were reviewed in detail with the patient.  All questions were answered. Has home bp cuff. Check bp weekly, let us know if >140/90.   Follow-up: Return in about 4 weeks (around 04/10/2019) for Return OB - My Chart video.  Orders Placed This Encounter  Procedures  . AFP, Serum, Open Spina Bifida  . Ambulatory referral to Breast Clinic   Raelyn Mora MSN, [place] 03/13/2019

## 2019-03-15 ENCOUNTER — Encounter: Payer: Self-pay | Admitting: Family Medicine

## 2019-03-15 LAB — AFP, SERUM, OPEN SPINA BIFIDA
AFP MoM: 1.13
AFP Value: 47.9 ng/mL
Gest. Age on Collection Date: 16 weeks
Maternal Age At EDD: 20.7 yr
OSBR Risk 1 IN: 10000
Test Results:: NEGATIVE
Weight: 120 [lb_av]

## 2019-03-28 ENCOUNTER — Other Ambulatory Visit: Payer: Self-pay | Admitting: Obstetrics and Gynecology

## 2019-03-28 DIAGNOSIS — N644 Mastodynia: Secondary | ICD-10-CM

## 2019-04-02 ENCOUNTER — Ambulatory Visit (HOSPITAL_COMMUNITY)
Admission: RE | Admit: 2019-04-02 | Payer: Medicaid Other | Source: Ambulatory Visit | Attending: Family Medicine | Admitting: Family Medicine

## 2019-04-05 ENCOUNTER — Ambulatory Visit (HOSPITAL_COMMUNITY)
Admission: RE | Admit: 2019-04-05 | Discharge: 2019-04-05 | Disposition: A | Discharge status: Home / Self Care | Payer: Medicaid Other | Source: Ambulatory Visit | Attending: Obstetrics and Gynecology | Admitting: Obstetrics and Gynecology

## 2019-04-05 ENCOUNTER — Other Ambulatory Visit: Payer: Self-pay

## 2019-04-05 ENCOUNTER — Other Ambulatory Visit (HOSPITAL_COMMUNITY): Payer: Self-pay | Admitting: *Deleted

## 2019-04-05 DIAGNOSIS — Z3400 Encounter for supervision of normal first pregnancy, unspecified trimester: Secondary | ICD-10-CM | POA: Diagnosis not present

## 2019-04-05 DIAGNOSIS — Z363 Encounter for antenatal screening for malformations: Secondary | ICD-10-CM | POA: Diagnosis not present

## 2019-04-05 DIAGNOSIS — Z3A19 19 weeks gestation of pregnancy: Secondary | ICD-10-CM

## 2019-04-05 DIAGNOSIS — Z148 Genetic carrier of other disease: Secondary | ICD-10-CM

## 2019-04-05 DIAGNOSIS — Z362 Encounter for other antenatal screening follow-up: Secondary | ICD-10-CM

## 2019-04-05 DIAGNOSIS — O321XX0 Maternal care for breech presentation, not applicable or unspecified: Secondary | ICD-10-CM | POA: Diagnosis not present

## 2019-04-06 ENCOUNTER — Other Ambulatory Visit: Payer: Self-pay | Admitting: Obstetrics and Gynecology

## 2019-04-06 ENCOUNTER — Ambulatory Visit
Admission: RE | Admit: 2019-04-06 | Discharge: 2019-04-06 | Disposition: A | Discharge status: Home / Self Care | Payer: Medicaid Other | Source: Ambulatory Visit | Attending: Obstetrics and Gynecology | Admitting: Obstetrics and Gynecology

## 2019-04-06 DIAGNOSIS — N644 Mastodynia: Secondary | ICD-10-CM

## 2019-04-06 DIAGNOSIS — N6322 Unspecified lump in the left breast, upper inner quadrant: Secondary | ICD-10-CM | POA: Diagnosis not present

## 2019-04-06 DIAGNOSIS — N6320 Unspecified lump in the left breast, unspecified quadrant: Secondary | ICD-10-CM

## 2019-04-10 ENCOUNTER — Telehealth (INDEPENDENT_AMBULATORY_CARE_PROVIDER_SITE_OTHER): Payer: Medicaid Other | Admitting: Obstetrics

## 2019-04-10 ENCOUNTER — Encounter: Payer: Self-pay | Admitting: Obstetrics

## 2019-04-10 DIAGNOSIS — Z3A20 20 weeks gestation of pregnancy: Secondary | ICD-10-CM

## 2019-04-10 DIAGNOSIS — O219 Vomiting of pregnancy, unspecified: Secondary | ICD-10-CM

## 2019-04-10 DIAGNOSIS — Z3490 Encounter for supervision of normal pregnancy, unspecified, unspecified trimester: Secondary | ICD-10-CM

## 2019-04-10 DIAGNOSIS — M549 Dorsalgia, unspecified: Secondary | ICD-10-CM

## 2019-04-10 MED ORDER — DOXYLAMINE-PYRIDOXINE 10-10 MG PO TBEC: DELAYED_RELEASE_TABLET | ORAL | 5 refills | Status: DC

## 2019-04-10 MED ORDER — COMFORT FIT MATERNITY SUPP SM MISC: 0 refills | Status: DC

## 2019-04-10 NOTE — Progress Notes (Signed)
OBSTETRICS PRENATAL VIRTUAL VISIT ENCOUNTER NOTE  Provider location: [facility] for [facility] [place]   I connected with Molly Evans on 04/10/19 at  3:00 PM EDT by MyChart Video Encounter at home and verified that I am speaking with the correct person using two identifiers.   I discussed the limitations, risks, security and privacy concerns of performing an evaluation and management service virtually and the availability of in person appointments. I also discussed with the patient that there may be a patient responsible charge related to this service. The patient expressed understanding and agreed to proceed. Subjective:  Molly Evans is a 19 y.o. G1P0 at [redacted]w[redacted]d being seen today for ongoing prenatal care.  She is currently monitored for the following issues for this low-risk pregnancy and has Chlamydia infection and Supervision of normal pregnancy on their problem list.  Patient reports backache, nausea and vomiting.  Contractions: Not present. Vag. Bleeding: None.  Movement: Present. Denies any leaking of fluid.   The following portions of the patient's history were reviewed and updated as appropriate: allergies, current medications, past family history, past medical history, past social history, past surgical history and problem list.   Objective:  There were no vitals filed for this visit.  Fetal Status:     Movement: Present     General:  Alert, oriented and cooperative. Patient is in no acute distress.  Respiratory: Normal respiratory effort, no problems with respiration noted  Mental Status: Normal mood and affect. Normal behavior. Normal judgment and thought content.  Rest of physical exam deferred due to type of encounter  Imaging: US BREAST LTD UNI LEFT INC AXILLA  Result Date: 04/06/2019 CLINICAL DATA:  Bilateral nipple tenderness. EXAM: ULTRASOUND OF THE BILATERAL BREAST COMPARISON:  None FINDINGS: On physical exam, no overt signs of mastitis are  identified. Targeted ultrasound is performed, showing no sonographic abnormalities in the right breast. No correlate for the patient's nipple tenderness on the left. The patient does have a mass in the left breast at 11 o'clock, 13 cm from the nipple measuring 2 by 1.9 x 0.8 cm. She states this mass is been stable for a long time, at least a year. IMPRESSION: Probably benign left breast mass. No cause for the patient's nipple tenderness identified. RECOMMENDATION: Treatment of the patient's nipple pain should be based on clinical and physical exam given lack of imaging findings. The probably benign left breast mass should be followed in 6 months with an ultrasound to ensure stability. I have discussed the findings and recommendations with the patient. If applicable, a reminder letter will be sent to the patient regarding the next appointment. BI-RADS CATEGORY  3: Probably benign. Electronically Signed   By: Gerome Sam III M.D   On: 04/06/2019 12:22   US BREAST LTD UNI RIGHT INC AXILLA  Result Date: 04/06/2019 CLINICAL DATA:  Bilateral nipple tenderness. EXAM: ULTRASOUND OF THE BILATERAL BREAST COMPARISON:  None FINDINGS: On physical exam, no overt signs of mastitis are identified. Targeted ultrasound is performed, showing no sonographic abnormalities in the right breast. No correlate for the patient's nipple tenderness on the left. The patient does have a mass in the left breast at 11 o'clock, 13 cm from the nipple measuring 2 by 1.9 x 0.8 cm. She states this mass is been stable for a long time, at least a year. IMPRESSION: Probably benign left breast mass. No cause for the patient's nipple tenderness identified. RECOMMENDATION: Treatment of the patient's nipple pain should be based on  clinical and physical exam given lack of imaging findings. The probably benign left breast mass should be followed in 6 months with an ultrasound to ensure stability. I have discussed the findings and recommendations with the  patient. If applicable, a reminder letter will be sent to the patient regarding the next appointment. BI-RADS CATEGORY  3: Probably benign. Electronically Signed   By: Gerome Samavid  Williams III M.D   On: 04/06/2019 12:22   US MFM OB COMP + 14 WK  Result Date: 04/05/2019 ----------------------------------------------------------------------  OBSTETRICS REPORT                       (Signed Final 04/05/2019 08:47 am) ---------------------------------------------------------------------- Patient Info  ID #:       [id number]                          D.O.B.:  [birth year] (18 yrs)  Name:       Molly AntisJIMARIA L Evans              Visit Date: 04/05/2019 07:53 am ---------------------------------------------------------------------- Performed By  Performed By:     Percell BostonHeather Waken          Ref. Address:     [address]                    [address]                                                             [address]                                                             [address]                                                             [place]                                                             [phone]  Attending:        Noralee Spaceavi Shankar MD        [facility]  [facility]:      [facility] ---------------------------------------------------------------------- Orders   #  Description  Code         Ordered By   1  [place] MFM OB COMP + 14 WK               [id number]     HAILEY SPARACINO  ----------------------------------------------------------------------   #  Order #                    Accession #                 Episode #   1  [id number]                  [id number]                  [id number]  ---------------------------------------------------------------------- Indications   [redacted] weeks gestation of pregnancy                Z3A.19   Encounter for antenatal screening for          Z36.3   malformations   Genetic carrier  (silent alpha thal, increased  Z14.8   risk SMA)   Low risk NIPS, 14.1FF, neg AFP  ---------------------------------------------------------------------- Fetal Evaluation  Num Of Fetuses:         1  Fetal Heart Rate(bpm):  137  Cardiac Activity:       Observed  Presentation:           Breech  Placenta:               Anterior  P. Cord Insertion:      Visualized, central  Amniotic Fluid  AFI FV:      Within normal limits                              Largest Pocket(cm)                              4.89 ---------------------------------------------------------------------- Biometry  BPD:      41.9  mm     G. Age:  18w 5d         20  %    CI:        74.23   %    70 - 86                                                          FL/HC:      17.1   %    16.1 - 18.3  HC:      154.4  mm     G. Age:  18w 3d          6  %    HC/AC:      1.14        1.09 - 1.39  AC:      135.2  mm     G. Age:  19w 0d         30  %    FL/BPD:     63.0   %  FL:       26.4  mm     G. Age:  18w 0d          6  %  FL/AC:      19.5   %    20 - 24  HUM:      25.9  mm     G. Age:  18w 1d         17  %  CER:      18.2  mm     G. Age:  18w 1d         11  %  NFT:       4.1  mm  CM:        3.9  mm  Est. FW:     244  gm      0 lb 9 oz      8  % ---------------------------------------------------------------------- OB History  Gravidity:    1         Term:   0        Prem:   0        SAB:   0  TOP:          0       Ectopic:  0        Living: 0 ---------------------------------------------------------------------- Gestational Age  LMP:           19w 3d        Date:  11/20/18                 EDD:   08/27/19  U/S Today:     18w 4d                                        EDD:   09/02/19  Best:          19w 3d     Det. By:  LMP  (11/20/18)          EDD:   08/27/19 ---------------------------------------------------------------------- Anatomy  Cranium:               Appears normal         Aortic Arch:            Appears normal  Cavum:                 Appears normal          Ductal Arch:            Appears normal  Ventricles:            Appears normal         Diaphragm:              Appears normal  Choroid Plexus:        Appears normal         Stomach:                Appears normal, left                                                                        sided  Cerebellum:            Appears normal         Abdomen:  Appears normal  Posterior Fossa:       Appears normal         Abdominal Wall:         Appears nml (cord                                                                        insert, abd wall)  Nuchal Fold:           Appears normal         Cord Vessels:           Appears normal (3                                                                        vessel cord)  Face:                  Orbits nl; profile not Kidneys:                Appear normal                         well visualized  Lips:                  Appears normal         Bladder:                Appears normal  Thoracic:              Appears normal         Spine:                  Appears normal  Heart:                 Not well visualized    Upper Extremities:      Appears normal  RVOT:                  Not well visualized    Lower Extremities:      Appears normal  LVOT:                  Not well visualized  Other:  Technically difficult due to fetal position. Heels and 5th digit visualized. ---------------------------------------------------------------------- Cervix Uterus Adnexa  Cervix  Length:            3.2  cm.  Normal appearance by transabdominal scan.  Uterus  No abnormality visualized.  Left Ovary  No adnexal mass visualized.  Right Ovary  No adnexal mass visualized.  Cul De Sac  No free fluid seen.  Adnexa  No abnormality visualized. ---------------------------------------------------------------------- Impression  Ms. Randol Kern, G1 P0, is here for fetal anatomy scan.  On cell-free fetal DNA screening, the risks of fetal  aneuploidies are not increased. MSAFP screening showed  low  risk for open-neural tube defects.  We performed fetal anatomy scan. No makers of  aneuploidies or fetal structural defects  are seen. Fetal  biometry is consistent with her previously-established dates.  Amniotic fluid is normal and good fetal activity is seen.  Patient understands the limitations of ultrasound in detecting  fetal anomalies.  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 She  also has an increased carrier risk for Spinal Muscular  Atrophy. I briefly discussed the carrier screening results and  recommended genetic counseling. Patient agreed to meet  with our genetic counselor. ---------------------------------------------------------------------- Recommendations  -An appointment was made for her to return in 4 weeks for  completion of fetal anatomy.  -Genetic counseling appointment in 1 to 2 weeks. ----------------------------------------------------------------------                  Noralee Space, MD Electronically Signed Final Report   04/05/2019 08:47 am ----------------------------------------------------------------------   Assessment and Plan:  Pregnancy: G1P0 at [redacted]w[redacted]d 1. Encounter for supervision of normal pregnancy, antepartum, unspecified gravidity  2. Backache symptom Rx: - Elastic Bandages & Supports (COMFORT FIT MATERNITY SUPP SM) MISC; Wear as directed.  Dispense: 1 each; Refill: 0  3. Nausea and vomiting during pregnancy Rx: - Doxylamine-Pyridoxine (DICLEGIS) 10-10 MG TBEC; 1 tab in AM, 1 tab mid afternoon 2 tabs at bedtime. Max dose 4 tabs daily.  Dispense: 100 tablet; Refill: 5  Preterm labor symptoms and general obstetric precautions including but not limited to vaginal bleeding, contractions, leaking of fluid and fetal movement were reviewed in detail with the patient. I discussed the assessment and treatment plan with the patient. The patient was provided an opportunity to ask questions and all were answered. The patient agreed with the plan and  demonstrated an understanding of the instructions. The patient was advised to call back or seek an in-person office evaluation/go to MAU at [facility] for any urgent or concerning symptoms. Please refer to After Visit Summary for other counseling recommendations.   I provided 10 minutes of face-to-face time during this encounter.  Return in about 4 weeks (around 05/08/2019) for MyChart.  Future Appointments  Date Time Provider Department Center  05/03/2019  1:30 PM WH-MFC NURSE WH-MFC MFC-US  05/03/2019  1:30 PM WH-MFC [place] 1 WH-MFCUS MFC-US  05/03/2019  2:30 PM WH-MFC GENETIC COUNSELING RM WH-MFC MFC-US  10/08/2019 12:40 PM GI-BCG [place] 2 GI-BCGUS GI-BREAST CE    Coral Ceo, MD Center for [facility], [facility] 04/10/2019

## 2019-04-30 ENCOUNTER — Encounter (HOSPITAL_COMMUNITY): Payer: Self-pay | Admitting: *Deleted

## 2019-04-30 ENCOUNTER — Other Ambulatory Visit: Payer: Self-pay

## 2019-04-30 ENCOUNTER — Emergency Department (HOSPITAL_COMMUNITY)
Admission: EM | Admit: 2019-04-30 | Discharge: 2019-05-01 | Disposition: A | Discharge status: Home / Self Care | Payer: Medicaid Other | Attending: Emergency Medicine | Admitting: Emergency Medicine

## 2019-04-30 DIAGNOSIS — R52 Pain, unspecified: Secondary | ICD-10-CM | POA: Diagnosis not present

## 2019-04-30 DIAGNOSIS — Z3A23 23 weeks gestation of pregnancy: Secondary | ICD-10-CM | POA: Diagnosis not present

## 2019-04-30 DIAGNOSIS — Z041 Encounter for examination and observation following transport accident: Secondary | ICD-10-CM | POA: Insufficient documentation

## 2019-04-30 DIAGNOSIS — O99891 Other specified diseases and conditions complicating pregnancy: Secondary | ICD-10-CM | POA: Diagnosis not present

## 2019-04-30 DIAGNOSIS — M542 Cervicalgia: Secondary | ICD-10-CM | POA: Insufficient documentation
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
## 2019-05-01 NOTE — ED Notes (Signed)
Pt called for vitals x3, no response. This tech does not see pt in waiting room

## 2019-05-02 ENCOUNTER — Ambulatory Visit (INDEPENDENT_AMBULATORY_CARE_PROVIDER_SITE_OTHER): Payer: Medicaid Other

## 2019-05-02 ENCOUNTER — Other Ambulatory Visit: Payer: Self-pay

## 2019-05-02 ENCOUNTER — Other Ambulatory Visit (HOSPITAL_COMMUNITY)
Admission: RE | Admit: 2019-05-02 | Discharge: 2019-05-02 | Disposition: A | Discharge status: Home / Self Care | Payer: Medicaid Other | Source: Ambulatory Visit | Attending: Obstetrics & Gynecology | Admitting: Obstetrics & Gynecology

## 2019-05-02 DIAGNOSIS — Z113 Encounter for screening for infections with a predominantly sexual mode of transmission: Secondary | ICD-10-CM

## 2019-05-02 NOTE — Progress Notes (Signed)
Molly Evans is here for routine STD screening. She reports having tan discharge for the past several weeks.  Bp cuff given to patient. -EH/RMA

## 2019-05-03 ENCOUNTER — Other Ambulatory Visit (HOSPITAL_COMMUNITY): Payer: Self-pay | Admitting: Obstetrics and Gynecology

## 2019-05-03 ENCOUNTER — Ambulatory Visit (HOSPITAL_COMMUNITY)
Admission: RE | Admit: 2019-05-03 | Discharge: 2019-05-03 | Disposition: A | Discharge status: Home / Self Care | Payer: Medicaid Other | Source: Ambulatory Visit | Attending: Obstetrics and Gynecology | Admitting: Obstetrics and Gynecology

## 2019-05-03 ENCOUNTER — Ambulatory Visit (HOSPITAL_COMMUNITY): Payer: Medicaid Other

## 2019-05-03 ENCOUNTER — Other Ambulatory Visit (HOSPITAL_COMMUNITY): Payer: Self-pay | Admitting: *Deleted

## 2019-05-03 ENCOUNTER — Ambulatory Visit (HOSPITAL_COMMUNITY): Payer: Medicaid Other | Admitting: *Deleted

## 2019-05-03 ENCOUNTER — Encounter (HOSPITAL_COMMUNITY): Payer: Self-pay

## 2019-05-03 DIAGNOSIS — Z3A23 23 weeks gestation of pregnancy: Secondary | ICD-10-CM | POA: Diagnosis not present

## 2019-05-03 DIAGNOSIS — Z3490 Encounter for supervision of normal pregnancy, unspecified, unspecified trimester: Secondary | ICD-10-CM | POA: Diagnosis not present

## 2019-05-03 DIAGNOSIS — O365920 Maternal care for other known or suspected poor fetal growth, second trimester, not applicable or unspecified: Secondary | ICD-10-CM

## 2019-05-03 DIAGNOSIS — Z362 Encounter for other antenatal screening follow-up: Secondary | ICD-10-CM

## 2019-05-03 LAB — CERVICOVAGINAL ANCILLARY ONLY
Bacterial Vaginitis (gardnerella): POSITIVE — AB
Candida Glabrata: NEGATIVE
Candida Vaginitis: POSITIVE — AB
Chlamydia: POSITIVE — AB
Comment: NEGATIVE
Comment: NEGATIVE
Comment: NEGATIVE
Comment: NEGATIVE
Comment: NEGATIVE
Comment: NORMAL
Neisseria Gonorrhea: NEGATIVE
Trichomonas: NEGATIVE

## 2019-05-03 NOTE — Progress Notes (Signed)
Patient ID: Molly Evans, female   DOB: 2000-08-29, 18 y.o.   MRN: [id number] Patient seen and assessed by nursing staff during this encounter. I have reviewed the chart and agree with the documentation and plan. I have also made any necessary editorial changes.  Scheryl Darter, MD 05/03/2019 9:25 AM

## 2019-05-05 ENCOUNTER — Other Ambulatory Visit: Payer: Self-pay | Admitting: Obstetrics & Gynecology

## 2019-05-05 DIAGNOSIS — A749 Chlamydial infection, unspecified: Secondary | ICD-10-CM

## 2019-05-05 MED ORDER — AZITHROMYCIN 500 MG PO TABS: 1000.0000 mg | ORAL_TABLET | Freq: Once | ORAL | 1 refills | Status: DC

## 2019-05-05 NOTE — Progress Notes (Signed)
Patient needs Azithromycin 1 g, ordered

## 2019-05-07 ENCOUNTER — Other Ambulatory Visit: Payer: Self-pay

## 2019-05-07 ENCOUNTER — Other Ambulatory Visit: Payer: Self-pay | Admitting: *Deleted

## 2019-05-07 DIAGNOSIS — A749 Chlamydial infection, unspecified: Secondary | ICD-10-CM

## 2019-05-07 MED ORDER — AZITHROMYCIN 500 MG PO TABS: 1000.0000 mg | ORAL_TABLET | Freq: Once | ORAL | 1 refills | Status: DC

## 2019-05-07 MED ORDER — AZITHROMYCIN 200 MG/5ML PO SUSR: 1000.0000 mg | Freq: Once | ORAL | 0 refills | Status: AC

## 2019-05-07 NOTE — Progress Notes (Signed)
Pt called to office asking if Medication, Zithromax, that was recently resent comes in liquid form.  Reviewed with Dr Jolayne Panther- is available in liquid and may send in today.  Pt has been made aware that new Rx for Zithromax suspension was sent.

## 2019-05-07 NOTE — Progress Notes (Signed)
Pt was not able to keep Rx down.  Resent Treatment Rx.

## 2019-05-08 ENCOUNTER — Encounter: Payer: Self-pay | Admitting: Nurse Practitioner

## 2019-05-08 ENCOUNTER — Encounter (HOSPITAL_COMMUNITY): Payer: Self-pay | Admitting: Family Medicine

## 2019-05-08 ENCOUNTER — Other Ambulatory Visit: Payer: Self-pay

## 2019-05-08 ENCOUNTER — Inpatient Hospital Stay (HOSPITAL_COMMUNITY)
Admission: AD | Admit: 2019-05-08 | Discharge: 2019-05-08 | Disposition: A | Discharge status: Home / Self Care | Payer: Medicaid Other | Attending: Family Medicine | Admitting: Family Medicine

## 2019-05-08 ENCOUNTER — Telehealth (INDEPENDENT_AMBULATORY_CARE_PROVIDER_SITE_OTHER): Payer: Medicaid Other | Admitting: Nurse Practitioner

## 2019-05-08 VITALS — BP 124/74 | HR 87 | Wt 128.2 lb

## 2019-05-08 DIAGNOSIS — Z3402 Encounter for supervision of normal first pregnancy, second trimester: Secondary | ICD-10-CM

## 2019-05-08 DIAGNOSIS — A749 Chlamydial infection, unspecified: Secondary | ICD-10-CM

## 2019-05-08 DIAGNOSIS — O26892 Other specified pregnancy related conditions, second trimester: Secondary | ICD-10-CM | POA: Insufficient documentation

## 2019-05-08 DIAGNOSIS — O98312 Other infections with a predominantly sexual mode of transmission complicating pregnancy, second trimester: Secondary | ICD-10-CM | POA: Diagnosis not present

## 2019-05-08 DIAGNOSIS — R109 Unspecified abdominal pain: Secondary | ICD-10-CM | POA: Diagnosis not present

## 2019-05-08 DIAGNOSIS — Z3A24 24 weeks gestation of pregnancy: Secondary | ICD-10-CM | POA: Diagnosis not present

## 2019-05-08 DIAGNOSIS — Z87891 Personal history of nicotine dependence: Secondary | ICD-10-CM | POA: Diagnosis not present

## 2019-05-08 DIAGNOSIS — Z3490 Encounter for supervision of normal pregnancy, unspecified, unspecified trimester: Secondary | ICD-10-CM

## 2019-05-08 LAB — URINALYSIS, ROUTINE W REFLEX MICROSCOPIC
Bacteria, UA: NONE SEEN
Bilirubin Urine: NEGATIVE
Glucose, UA: NEGATIVE mg/dL
Hgb urine dipstick: NEGATIVE
Ketones, ur: NEGATIVE mg/dL
Nitrite: NEGATIVE
Protein, ur: NEGATIVE mg/dL
Specific Gravity, Urine: 1.008 (ref 1.005–1.030)
pH: 7 (ref 5.0–8.0)

## 2019-05-08 MED ORDER — AZITHROMYCIN 500 MG PO TABS
1000.0000 mg | ORAL_TABLET | Freq: Once | ORAL | Status: AC
  Administered 2019-05-08: 1000 mg via ORAL

## 2019-05-08 MED ORDER — ACETAMINOPHEN 500 MG PO TABS
1000.0000 mg | ORAL_TABLET | Freq: Once | ORAL | Status: AC
  Administered 2019-05-08: 1000 mg via ORAL
  Filled 2019-05-08: qty 2
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
## 2019-05-08 NOTE — Discharge Instructions (Signed)
Abdominal Pain During Pregnancy  Belly (abdominal) pain is common during pregnancy. There are many possible causes. Most of the time, it is not a serious problem. Other times, it can be a sign that something is wrong with the pregnancy. Always tell your doctor if you have belly pain. Follow these instructions at home:  Do not have sex or put anything in your vagina until your pain goes away completely.  Get plenty of rest until your pain gets better.  Drink enough fluid to keep your pee (urine) pale yellow.  Take over-the-counter and prescription medicines only as told by your doctor.  Keep all follow-up visits as told by your doctor. This is important. Contact a doctor if:  Your pain continues or gets worse after resting.  You have lower belly pain that: ? Comes and goes at regular times. ? Spreads to your back. ? Feels like menstrual cramps.  You have pain or burning when you pee (urinate). Get help right away if:  You have a fever or chills.  You have vaginal bleeding.  You are leaking fluid from your vagina.  You are passing tissue from your vagina.  You throw up (vomit) for more than 24 hours.  You have watery poop (diarrhea) for more than 24 hours.  Your baby is moving less than usual.  You feel very weak or faint.  You have shortness of breath.  You have very bad pain in your upper belly. Summary  Belly (abdominal) pain is common during pregnancy. There are many possible causes.  If you have belly pain during pregnancy, tell your doctor right away.  Keep all follow-up visits as told by your doctor. This is important. This information is not intended to replace advice given to you by your health care provider. Make sure you discuss any questions you have with your health care provider. Document Revised: 04/24/2018 Document Reviewed: 04/08/2016 Elsevier Patient Education  2020 [employer]  
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
## 2019-05-08 NOTE — MAU Note (Signed)
Pain in lower abd, so bad she can't sleep.  Right now "her stomach is locked", tight.  No bleeding or leaking. cx closed at office.

## 2019-05-08 NOTE — Progress Notes (Signed)
I connected with  [name] on 05/08/19 by a video enabled telemedicine application and verified that I am speaking with the correct person using two identifiers.   I discussed the limitations of evaluation and management by telemedicine. The patient expressed understanding and agreed to proceed.  MyChart OB, c/o abdominal pain 7-8/10 x 5 days.  Denies bleeding, LOF.  She did not check her BP before leaving home.
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
## 2019-05-24 ENCOUNTER — Encounter: Payer: Self-pay | Admitting: Nurse Practitioner

## 2019-05-24 ENCOUNTER — Ambulatory Visit (INDEPENDENT_AMBULATORY_CARE_PROVIDER_SITE_OTHER): Payer: Medicaid Other | Admitting: Nurse Practitioner

## 2019-05-24 ENCOUNTER — Ambulatory Visit (HOSPITAL_COMMUNITY): Payer: Medicaid Other | Attending: Obstetrics and Gynecology

## 2019-05-24 ENCOUNTER — Other Ambulatory Visit: Payer: Self-pay

## 2019-05-24 ENCOUNTER — Ambulatory Visit: Payer: Medicaid Other | Admitting: *Deleted

## 2019-05-24 ENCOUNTER — Encounter: Payer: Self-pay | Admitting: *Deleted

## 2019-05-24 VITALS — BP 112/62 | HR 82 | Temp 97.8°F

## 2019-05-24 DIAGNOSIS — Z3A26 26 weeks gestation of pregnancy: Secondary | ICD-10-CM

## 2019-05-24 DIAGNOSIS — O365920 Maternal care for other known or suspected poor fetal growth, second trimester, not applicable or unspecified: Secondary | ICD-10-CM | POA: Diagnosis not present

## 2019-05-24 DIAGNOSIS — Z148 Genetic carrier of other disease: Secondary | ICD-10-CM | POA: Diagnosis not present

## 2019-05-24 DIAGNOSIS — O365990 Maternal care for other known or suspected poor fetal growth, unspecified trimester, not applicable or unspecified: Secondary | ICD-10-CM | POA: Insufficient documentation

## 2019-05-24 DIAGNOSIS — Z3403 Encounter for supervision of normal first pregnancy, third trimester: Secondary | ICD-10-CM

## 2019-05-24 DIAGNOSIS — A749 Chlamydial infection, unspecified: Secondary | ICD-10-CM

## 2019-05-24 DIAGNOSIS — Z362 Encounter for other antenatal screening follow-up: Secondary | ICD-10-CM | POA: Insufficient documentation

## 2019-05-24 NOTE — Patient Instructions (Signed)
Childbirth Education Options: [facility] Classes:  Childbirth education classes can help you get ready for a positive parenting experience. You can also meet other expectant parents and get free stuff for your baby. Each class runs for five weeks on the same night and costs $45 for the mother-to-be and her support person. Medicaid covers the cost if you are eligible. Call [phone] to register. [facility] Childbirth Education:  [phone] or [phone] or [email or web address]  Baby & Me Class: Discuss newborn & infant parenting and family adjustment issues with other new mothers in a relaxed environment. Each week brings a new speaker or baby-centered activity. We encourage new mothers to join us every Thursday at 11:00am. Babies birth until crawling. No registration or fee. Daddy Boot Camp: This course offers Dads-to-be the tools and knowledge needed to feel confident on their journey to becoming new fathers. Experienced dads, who have been trained as coaches, teach dads-to-be how to hold, comfort, diaper, swaddle and play with their infant while being able to support the new mom as well. A class for men taught by men. $25/dad Big Brother/Big Sister: Let your children share in the joy of a new brother or sister in this special class designed just for them. Class includes discussion about how families care for babies: swaddling, holding, diapering, safety as well as how they can be helpful in their new role. This class is designed for children ages 2 to 6, but any age is welcome. Please register each child individually. $5/child  Mom Talk: This mom-led group offers support and connection to mothers as they journey through the adjustments and struggles of that sometimes overwhelming first year after the birth of a child. Tuesdays at 10:00am and Thursdays at 6:00pm. Babies welcome. No registration or fee. Breastfeeding Support Group: This group is a mother-to-mother  support circle where moms have the opportunity to share their breastfeeding experiences. A Lactation Consultant is present for questions and concerns. Meets each Tuesday at 11:00am. No fee or registration. Breastfeeding Your Baby: Learn what to expect in the first days of breastfeeding your newborn.  This class will help you feel more confident with the skills needed to begin your breastfeeding experience. Many new mothers are concerned about breastfeeding after leaving the hospital. This class will also address the most common fears and challenges about breastfeeding during the first few weeks, months and beyond. (call for fee) Comfort Techniques and Tour: This 2 hour interactive class will provide you the opportunity to learn & practice hands-on techniques that can help relieve some of the discomfort of labor and encourage your baby to rotate toward the best position for birth. You and your partner will be able to try a variety of labor positions with birth balls and rebozos as well as practice breathing, relaxation, and visualization techniques. A tour of the [facility] is included with this class. $20 per registrant and support person Childbirth Class- Weekend Option: This class is a Weekend version of our Birth & Baby series. It is designed for parents who have a difficult time fitting several weeks of classes into their schedule. It covers the care of your newborn and the basics of labor and childbirth. It also includes a Maternity Care Center Tour of [facility] and lunch. The class is held two consecutive days: beginning on Friday evening from 6:30 - 8:30 p.m. and the next day, Saturday from 9 a.m. - 4 p.m. (call for fee) Waterbirth Class: Interested in a waterbirth?  This   informational class will help you discover whether waterbirth is the right fit for you. Education about waterbirth itself, supplies you would need and how to assemble your support team is what you can  expect from this class. Some obstetrical practices require this class in order to pursue a waterbirth. (Not all obstetrical practices offer waterbirth-check with your healthcare provider.) Register only the expectant mom, but you are encouraged to bring your partner to class! Required if planning waterbirth, no fee. Infant/Child CPR: Parents, grandparents, babysitters, and friends learn Cardio-Pulmonary Resuscitation skills for infants and children. You will also learn how to treat both conscious and unconscious choking in infants and children. This Family & Friends program does not offer certification. Register each participant individually to ensure that enough mannequins are available. (Call for fee) Grandparent Love: Expecting a grandbaby? This class is for you! Learn about the latest infant care and safety recommendations and ways to support your own child as he or she transitions into the parenting role. Taught by Registered Nurses who are childbirth instructors, but most importantly...they are grandmothers too! $10/person. Childbirth Class- Natural Childbirth: This series of 5 weekly classes is for expectant parents who want to learn and practice natural methods of coping with the process of labor and childbirth. Relaxation, breathing, massage, visualization, role of the partner, and helpful positioning are highlighted. Participants learn how to be confident in their body's ability to give birth. This class will empower and help parents make informed decisions about their own care. Includes discussion that will help new parents transition into the immediate postpartum period. Maternity Care Center Tour of [facility] is included. We suggest taking this class between 25-32 weeks, but it's only a recommendation. $75 per registrant and one support person or $30 Medicaid. Childbirth Class- 3 week Series: This option of 3 weekly classes helps you and your labor partner prepare for childbirth. Newborn  care, labor & birth, cesarean birth, pain management, and comfort techniques are discussed and a Maternity Care Center Tour of [facility] is included. The class meets at the same time, on the same day of the week for 3 consecutive weeks beginning with the starting date you choose. $60 for registrant and one support person.  Marvelous Multiples: Expecting twins, triplets, or more? This class covers the differences in labor, birth, parenting, and breastfeeding issues that face multiples' parents. NICU tour is included. Led by a Certified Childbirth Educator who is the mother of twins. No fee. Caring for Baby: This class is for expectant and adoptive parents who want to learn and practice the most up-to-date newborn care for their babies. Focus is on birth through the first six weeks of life. Topics include feeding, bathing, diapering, crying, umbilical cord care, circumcision care and safe sleep. Parents learn to recognize symptoms of illness and when to call the pediatrician. Register only the mom-to-be and your partner or support person can plan to come with you! $10 per registrant and support person Childbirth Class- online option: This online class offers you the freedom to complete a Birth and Baby series in the comfort of your own home. The flexibility of this option allows you to review sections at your own pace, at times convenient to you and your support people. It includes additional video information, animations, quizzes, and extended activities. Get organized with helpful eClass tools, checklists, and trackers. Once you register online for the class, you will receive an email within a few days to accept the invitation and begin the class when the time   is right for you. The content will be available to you for 60 days. $60 for 60 days of online access for you and your support people.         
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
## 2019-05-24 NOTE — Progress Notes (Signed)
Pt presents for ROB w/o complaints today.  Needs to schedule 2 gtt

## 2019-06-07 ENCOUNTER — Encounter: Payer: Self-pay | Admitting: Obstetrics

## 2019-06-07 ENCOUNTER — Other Ambulatory Visit: Payer: Medicaid Other

## 2019-06-07 ENCOUNTER — Other Ambulatory Visit (HOSPITAL_COMMUNITY)
Admission: RE | Admit: 2019-06-07 | Discharge: 2019-06-07 | Disposition: A | Discharge status: Home / Self Care | Payer: Medicaid Other | Source: Ambulatory Visit | Attending: Obstetrics | Admitting: Obstetrics

## 2019-06-07 ENCOUNTER — Ambulatory Visit (INDEPENDENT_AMBULATORY_CARE_PROVIDER_SITE_OTHER): Payer: Medicaid Other | Admitting: Obstetrics

## 2019-06-07 ENCOUNTER — Other Ambulatory Visit: Payer: Self-pay

## 2019-06-07 VITALS — BP 101/68 | Wt 138.0 lb

## 2019-06-07 DIAGNOSIS — Z3480 Encounter for supervision of other normal pregnancy, unspecified trimester: Secondary | ICD-10-CM | POA: Insufficient documentation

## 2019-06-07 DIAGNOSIS — Z113 Encounter for screening for infections with a predominantly sexual mode of transmission: Secondary | ICD-10-CM | POA: Insufficient documentation

## 2019-06-07 DIAGNOSIS — Z3A28 28 weeks gestation of pregnancy: Secondary | ICD-10-CM

## 2019-06-07 MED ORDER — PRENATAL 27-0.8 MG PO TABS: 1.0000 | ORAL_TABLET | Freq: Every day | ORAL | 2 refills | Status: DC

## 2019-06-07 NOTE — Addendum Note (Signed)
Addended by: Brock Bad on: 06/07/2019 09:42 AM   Modules accepted: Level of Service
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
## 2019-06-08 LAB — GLUCOSE TOLERANCE, 2 HOURS W/ 1HR
Glucose, 1 hour: 107 mg/dL (ref 65–179)
Glucose, 2 hour: 90 mg/dL (ref 65–152)
Glucose, Fasting: 80 mg/dL (ref 65–91)

## 2019-06-08 LAB — CBC
Hematocrit: 34.5 % (ref 34.0–46.6)
Hemoglobin: 11.1 g/dL (ref 11.1–15.9)
MCH: 28.1 pg (ref 26.6–33.0)
MCHC: 32.2 g/dL (ref 31.5–35.7)
MCV: 87 fL (ref 79–97)
Platelets: 202 10*3/uL (ref 150–450)
RBC: 3.95 x10E6/uL (ref 3.77–5.28)
RDW: 12.5 % (ref 11.7–15.4)
WBC: 6.8 10*3/uL (ref 3.4–10.8)

## 2019-06-08 LAB — CERVICOVAGINAL ANCILLARY ONLY
Bacterial Vaginitis (gardnerella): NEGATIVE
Candida Glabrata: NEGATIVE
Candida Vaginitis: POSITIVE — AB
Chlamydia: NEGATIVE
Comment: NEGATIVE
Comment: NEGATIVE
Comment: NEGATIVE
Comment: NEGATIVE
Comment: NEGATIVE
Comment: NORMAL
Neisseria Gonorrhea: NEGATIVE
Trichomonas: NEGATIVE

## 2019-06-08 LAB — RPR: RPR Ser Ql: NONREACTIVE

## 2019-06-08 LAB — HIV ANTIBODY (ROUTINE TESTING W REFLEX): HIV Screen 4th Generation wRfx: NONREACTIVE

## 2019-06-09 ENCOUNTER — Other Ambulatory Visit: Payer: Self-pay | Admitting: Obstetrics

## 2019-06-09 DIAGNOSIS — B3731 Acute candidiasis of vulva and vagina: Secondary | ICD-10-CM

## 2019-06-09 MED ORDER — TERCONAZOLE 0.4 % VA CREA: 1.0000 | TOPICAL_CREAM | Freq: Every day | VAGINAL | 0 refills | Status: DC

## 2019-06-11 ENCOUNTER — Telehealth: Payer: Self-pay | Admitting: *Deleted

## 2019-06-11 NOTE — Telephone Encounter (Signed)
Pt made aware of recent results and Rx info.

## 2019-06-21 ENCOUNTER — Telehealth: Payer: Medicaid Other | Admitting: Family Medicine

## 2019-06-28 ENCOUNTER — Other Ambulatory Visit: Payer: Self-pay

## 2019-06-28 ENCOUNTER — Ambulatory Visit (INDEPENDENT_AMBULATORY_CARE_PROVIDER_SITE_OTHER): Payer: Medicaid Other | Admitting: Obstetrics & Gynecology

## 2019-06-28 VITALS — BP 103/64 | HR 90 | Wt 141.0 lb

## 2019-06-28 DIAGNOSIS — Z3480 Encounter for supervision of other normal pregnancy, unspecified trimester: Secondary | ICD-10-CM

## 2019-06-28 DIAGNOSIS — Z3A31 31 weeks gestation of pregnancy: Secondary | ICD-10-CM

## 2019-06-28 DIAGNOSIS — Z3483 Encounter for supervision of other normal pregnancy, third trimester: Secondary | ICD-10-CM
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
## 2019-07-12 ENCOUNTER — Ambulatory Visit (INDEPENDENT_AMBULATORY_CARE_PROVIDER_SITE_OTHER): Payer: Medicaid Other | Admitting: Obstetrics & Gynecology

## 2019-07-12 ENCOUNTER — Encounter: Payer: Self-pay | Admitting: Obstetrics & Gynecology

## 2019-07-12 ENCOUNTER — Other Ambulatory Visit: Payer: Self-pay

## 2019-07-12 VITALS — BP 109/65 | HR 85 | Wt 142.0 lb

## 2019-07-12 DIAGNOSIS — Z3483 Encounter for supervision of other normal pregnancy, third trimester: Secondary | ICD-10-CM

## 2019-07-12 DIAGNOSIS — Z3480 Encounter for supervision of other normal pregnancy, unspecified trimester: Secondary | ICD-10-CM

## 2019-07-12 DIAGNOSIS — Z3A33 33 weeks gestation of pregnancy: Secondary | ICD-10-CM

## 2019-07-12 MED ORDER — PRENATAL 27-0.8 MG PO TABS: 1.0000 | ORAL_TABLET | Freq: Every day | ORAL | 2 refills | Status: DC
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
## 2019-07-12 NOTE — Patient Instructions (Signed)
Return to office for any scheduled appointments. Call the office or go to the [facility] at [facility] at  if:  You begin to have strong, frequent contractions  Your water breaks.  Sometimes it is a big gush of fluid, sometimes it is just a trickle that keeps getting your panties wet or running down your legs  You have vaginal bleeding.  It is normal to have a small amount of spotting if your cervix was checked.   You do not feel your baby moving like normal.  If you do not, get something to eat and drink and lay down and focus on feeling your baby move.   If your baby is still not moving like normal, you should call the office or go to MAU.  Any other obstetric concerns.   

## 2019-07-19 ENCOUNTER — Ambulatory Visit: Payer: Medicaid Other

## 2019-07-26 ENCOUNTER — Encounter: Payer: Medicaid Other | Admitting: Women's Health

## 2019-08-06 ENCOUNTER — Other Ambulatory Visit: Payer: Self-pay

## 2019-08-06 ENCOUNTER — Ambulatory Visit (INDEPENDENT_AMBULATORY_CARE_PROVIDER_SITE_OTHER): Payer: Medicaid Other | Admitting: Advanced Practice Midwife

## 2019-08-06 ENCOUNTER — Other Ambulatory Visit (HOSPITAL_COMMUNITY)
Admission: RE | Admit: 2019-08-06 | Discharge: 2019-08-06 | Disposition: A | Discharge status: Home / Self Care | Payer: Medicaid Other | Source: Ambulatory Visit | Attending: Advanced Practice Midwife | Admitting: Advanced Practice Midwife

## 2019-08-06 VITALS — BP 115/74 | HR 81 | Wt 147.0 lb

## 2019-08-06 DIAGNOSIS — O368390 Maternal care for abnormalities of the fetal heart rate or rhythm, unspecified trimester, not applicable or unspecified: Secondary | ICD-10-CM

## 2019-08-06 DIAGNOSIS — Z3403 Encounter for supervision of normal first pregnancy, third trimester: Secondary | ICD-10-CM

## 2019-08-06 DIAGNOSIS — O368330 Maternal care for abnormalities of the fetal heart rate or rhythm, third trimester, not applicable or unspecified: Secondary | ICD-10-CM

## 2019-08-06 DIAGNOSIS — O26893 Other specified pregnancy related conditions, third trimester: Secondary | ICD-10-CM

## 2019-08-06 DIAGNOSIS — R102 Pelvic and perineal pain: Secondary | ICD-10-CM

## 2019-08-06 DIAGNOSIS — Z3A37 37 weeks gestation of pregnancy: Secondary | ICD-10-CM | POA: Diagnosis not present
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
## 2019-08-06 NOTE — Patient Instructions (Signed)
Things to Try After 37 weeks to Encourage Labor/Get Ready for Labor:   1.  Try the Miles Circuit at www.milescircuit.com daily to improve baby's position and encourage the onset of labor.  2. Walk a little and rest a little every day.  Change positions often.  3. Cervical Ripening: May try one or both a. Red Raspberry Leaf capsules or tea:  two 300mg or 400mg tablets with each meal, 2-3 times a day, or 1-3 cups of tea daily  Potential Side Effects Of Raspberry Leaf:  Most women do not experience any side effects from drinking raspberry leaf tea. However, nausea and loose stools are possible   b. Evening Primrose Oil capsules: may take 1 to 3 capsules daily. Take 1-2 capsules by mouth each day and place one capsule vaginally at night.  You may also prick the vaginal capsule to release the oil prior to inserting in the vagina. Some of the potential side effects:  Upset stomach  Loose stools or diarrhea  Headaches  Nausea  4. Sex (and especially sex with orgasm) can also help the cervix ripen and encourage labor onset.  Labor Precautions Reasons to come to MAU at [facility]:  1.  Contractions are  5 minutes apart or less, each last 1 minute, these have been going on for 1-2 hours, and you cannot walk or talk during them 2.  You have a large gush of fluid, or a trickle of fluid that will not stop and you have to wear a pad 3.  You have bleeding that is bright red, heavier than spotting--like menstrual bleeding (spotting can be normal in early labor or after a check of your cervix) 4.  You do not feel the baby moving like he/she normally does 

## 2019-08-06 NOTE — Progress Notes (Signed)
Pt states pelvic pain/pressure.

## 2019-08-07 LAB — CERVICOVAGINAL ANCILLARY ONLY
Chlamydia: NEGATIVE
Comment: NEGATIVE
Comment: NORMAL
Neisseria Gonorrhea: NEGATIVE

## 2019-08-08 LAB — STREP GP B NAA: Strep Gp B NAA: NEGATIVE

## 2019-08-13 ENCOUNTER — Encounter: Payer: Medicaid Other | Admitting: Nurse Practitioner

## 2019-08-15 ENCOUNTER — Ambulatory Visit (INDEPENDENT_AMBULATORY_CARE_PROVIDER_SITE_OTHER): Payer: Medicaid Other | Admitting: Obstetrics & Gynecology

## 2019-08-15 ENCOUNTER — Other Ambulatory Visit: Payer: Self-pay

## 2019-08-15 DIAGNOSIS — Z3483 Encounter for supervision of other normal pregnancy, third trimester: Secondary | ICD-10-CM

## 2019-08-15 DIAGNOSIS — Z3A38 38 weeks gestation of pregnancy: Secondary | ICD-10-CM
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
## 2019-08-15 NOTE — Patient Instructions (Signed)

## 2019-08-16 ENCOUNTER — Encounter (HOSPITAL_COMMUNITY): Payer: Self-pay | Admitting: *Deleted

## 2019-08-16 ENCOUNTER — Telehealth (HOSPITAL_COMMUNITY): Payer: Self-pay | Admitting: *Deleted

## 2019-08-16 NOTE — Telephone Encounter (Signed)
Preadmission screen  

## 2019-08-22 ENCOUNTER — Other Ambulatory Visit: Payer: Self-pay

## 2019-08-22 ENCOUNTER — Encounter: Payer: Self-pay | Admitting: Obstetrics

## 2019-08-22 ENCOUNTER — Ambulatory Visit (INDEPENDENT_AMBULATORY_CARE_PROVIDER_SITE_OTHER): Payer: Medicaid Other | Admitting: Obstetrics

## 2019-08-22 VITALS — BP 115/73 | HR 92 | Wt 151.0 lb

## 2019-08-22 DIAGNOSIS — Z3483 Encounter for supervision of other normal pregnancy, third trimester: Secondary | ICD-10-CM

## 2019-08-22 DIAGNOSIS — Z3A39 39 weeks gestation of pregnancy: Secondary | ICD-10-CM
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
## 2019-08-22 NOTE — Progress Notes (Signed)
Pt. Presents for ROB. Denies any questions or concerns at this time.  °

## 2019-08-29 ENCOUNTER — Other Ambulatory Visit: Payer: Self-pay | Admitting: Advanced Practice Midwife

## 2019-08-29 ENCOUNTER — Telehealth (INDEPENDENT_AMBULATORY_CARE_PROVIDER_SITE_OTHER): Payer: Medicaid Other | Admitting: Obstetrics

## 2019-08-29 ENCOUNTER — Encounter: Payer: Self-pay | Admitting: Obstetrics

## 2019-08-29 DIAGNOSIS — O480 Post-term pregnancy: Secondary | ICD-10-CM

## 2019-08-29 DIAGNOSIS — Z3A40 40 weeks gestation of pregnancy: Secondary | ICD-10-CM

## 2019-08-29 DIAGNOSIS — Z3483 Encounter for supervision of other normal pregnancy, third trimester: Secondary | ICD-10-CM
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
## 2019-08-30 ENCOUNTER — Other Ambulatory Visit: Payer: Self-pay

## 2019-08-30 ENCOUNTER — Inpatient Hospital Stay (HOSPITAL_COMMUNITY)
Admission: AD | Admit: 2019-08-30 | Discharge: 2019-09-01 | DRG: 805 | Disposition: A | Discharge status: Home / Self Care | Payer: Medicaid Other | Attending: Obstetrics & Gynecology | Admitting: Obstetrics & Gynecology

## 2019-08-30 ENCOUNTER — Ambulatory Visit (INDEPENDENT_AMBULATORY_CARE_PROVIDER_SITE_OTHER): Payer: Medicaid Other | Admitting: Obstetrics

## 2019-08-30 ENCOUNTER — Inpatient Hospital Stay (HOSPITAL_COMMUNITY): Payer: Medicaid Other | Admitting: Anesthesiology

## 2019-08-30 ENCOUNTER — Encounter: Payer: Self-pay | Admitting: Obstetrics

## 2019-08-30 ENCOUNTER — Encounter (HOSPITAL_COMMUNITY): Payer: Self-pay | Admitting: Obstetrics and Gynecology

## 2019-08-30 VITALS — BP 118/66 | HR 85 | Wt 154.0 lb

## 2019-08-30 DIAGNOSIS — U071 COVID-19: Secondary | ICD-10-CM | POA: Diagnosis present

## 2019-08-30 DIAGNOSIS — Z3483 Encounter for supervision of other normal pregnancy, third trimester: Secondary | ICD-10-CM

## 2019-08-30 DIAGNOSIS — D563 Thalassemia minor: Secondary | ICD-10-CM | POA: Diagnosis present

## 2019-08-30 DIAGNOSIS — O480 Post-term pregnancy: Secondary | ICD-10-CM | POA: Diagnosis not present

## 2019-08-30 DIAGNOSIS — O9952 Diseases of the respiratory system complicating childbirth: Secondary | ICD-10-CM | POA: Diagnosis not present

## 2019-08-30 DIAGNOSIS — Z87891 Personal history of nicotine dependence: Secondary | ICD-10-CM

## 2019-08-30 DIAGNOSIS — Z3A40 40 weeks gestation of pregnancy: Secondary | ICD-10-CM

## 2019-08-30 DIAGNOSIS — O9852 Other viral diseases complicating childbirth: Secondary | ICD-10-CM | POA: Diagnosis present

## 2019-08-30 LAB — SARS CORONAVIRUS 2 BY RT PCR (HOSPITAL ORDER, PERFORMED IN ~~LOC~~ HOSPITAL LAB): SARS Coronavirus 2: POSITIVE — AB

## 2019-08-30 LAB — CBC
HCT: 34.6 % — ABNORMAL LOW (ref 36.0–46.0)
Hemoglobin: 11 g/dL — ABNORMAL LOW (ref 12.0–15.0)
MCH: 26.7 pg (ref 26.0–34.0)
MCHC: 31.8 g/dL (ref 30.0–36.0)
MCV: 84 fL (ref 80.0–100.0)
Platelets: 218 10*3/uL (ref 150–400)
RBC: 4.12 MIL/uL (ref 3.87–5.11)
RDW: 13.8 % (ref 11.5–15.5)
WBC: 8 10*3/uL (ref 4.0–10.5)
nRBC: 0 % (ref 0.0–0.2)

## 2019-08-30 LAB — TYPE AND SCREEN
ABO/RH(D): A POS
Antibody Screen: NEGATIVE

## 2019-08-30 MED ORDER — PHENYLEPHRINE 40 MCG/ML (10ML) SYRINGE FOR IV PUSH (FOR BLOOD PRESSURE SUPPORT)
80.0000 ug | PREFILLED_SYRINGE | INTRAVENOUS | Status: DC | PRN
  Filled 2019-08-30: qty 10

## 2019-08-30 MED ORDER — FENTANYL-BUPIVACAINE-NACL 0.5-0.125-0.9 MG/250ML-% EP SOLN
12.0000 mL/h | EPIDURAL | Status: DC | PRN
  Filled 2019-08-30: qty 250

## 2019-08-30 MED ORDER — DIPHENHYDRAMINE HCL 50 MG/ML IJ SOLN: 12.5000 mg | INTRAMUSCULAR | Status: DC | PRN

## 2019-08-30 MED ORDER — LACTATED RINGERS IV BOLUS
1000.0000 mL | Freq: Once | INTRAVENOUS | Status: AC
  Administered 2019-08-30: 1000 mL via INTRAVENOUS

## 2019-08-30 MED ORDER — OXYCODONE-ACETAMINOPHEN 5-325 MG PO TABS: 2.0000 | ORAL_TABLET | ORAL | Status: DC | PRN

## 2019-08-30 MED ORDER — OXYTOCIN BOLUS FROM INFUSION
333.0000 mL | Freq: Once | INTRAVENOUS | Status: AC
  Administered 2019-08-30: 333 mL via INTRAVENOUS

## 2019-08-30 MED ORDER — SODIUM CHLORIDE (PF) 0.9 % IJ SOLN
INTRAMUSCULAR | Status: DC | PRN
  Administered 2019-08-30: 12 mL/h via EPIDURAL

## 2019-08-30 MED ORDER — SOD CITRATE-CITRIC ACID 500-334 MG/5ML PO SOLN: 30.0000 mL | ORAL | Status: DC | PRN

## 2019-08-30 MED ORDER — EPHEDRINE 5 MG/ML INJ: 10.0000 mg | INTRAVENOUS | Status: DC | PRN

## 2019-08-30 MED ORDER — FLEET ENEMA 7-19 GM/118ML RE ENEM: 1.0000 | ENEMA | RECTAL | Status: DC | PRN

## 2019-08-30 MED ORDER — OXYCODONE-ACETAMINOPHEN 5-325 MG PO TABS: 1.0000 | ORAL_TABLET | ORAL | Status: DC | PRN

## 2019-08-30 MED ORDER — OXYTOCIN-SODIUM CHLORIDE 30-0.9 UT/500ML-% IV SOLN: 1.0000 m[IU]/min | INTRAVENOUS | Status: DC

## 2019-08-30 MED ORDER — FENTANYL CITRATE (PF) 100 MCG/2ML IJ SOLN
100.0000 ug | INTRAMUSCULAR | Status: DC | PRN
  Administered 2019-08-30 (×2): 100 ug via INTRAVENOUS
  Filled 2019-08-30 (×2): qty 2

## 2019-08-30 MED ORDER — ONDANSETRON HCL 4 MG/2ML IJ SOLN
4.0000 mg | Freq: Four times a day (QID) | INTRAMUSCULAR | Status: DC | PRN
  Administered 2019-08-30: 4 mg via INTRAVENOUS
  Filled 2019-08-30: qty 2

## 2019-08-30 MED ORDER — PHENYLEPHRINE 40 MCG/ML (10ML) SYRINGE FOR IV PUSH (FOR BLOOD PRESSURE SUPPORT): 80.0000 ug | PREFILLED_SYRINGE | INTRAVENOUS | Status: DC | PRN

## 2019-08-30 MED ORDER — OXYTOCIN-SODIUM CHLORIDE 30-0.9 UT/500ML-% IV SOLN
2.5000 [IU]/h | INTRAVENOUS | Status: DC
  Filled 2019-08-30: qty 500

## 2019-08-30 MED ORDER — LIDOCAINE HCL (PF) 1 % IJ SOLN
INTRAMUSCULAR | Status: DC | PRN
  Administered 2019-08-30: 10 mL via EPIDURAL

## 2019-08-30 MED ORDER — ACETAMINOPHEN 325 MG PO TABS: 650.0000 mg | ORAL_TABLET | ORAL | Status: DC | PRN

## 2019-08-30 MED ORDER — TERBUTALINE SULFATE 1 MG/ML IJ SOLN: 0.2500 mg | Freq: Once | INTRAMUSCULAR | Status: DC | PRN

## 2019-08-30 MED ORDER — FENTANYL CITRATE (PF) 100 MCG/2ML IJ SOLN
100.0000 ug | Freq: Once | INTRAMUSCULAR | Status: AC
  Administered 2019-08-30: 100 ug via INTRAVENOUS
  Filled 2019-08-30: qty 2

## 2019-08-30 MED ORDER — LACTATED RINGERS IV SOLN: INTRAVENOUS | Status: DC

## 2019-08-30 MED ORDER — LACTATED RINGERS IV SOLN
500.0000 mL | Freq: Once | INTRAVENOUS | Status: AC
  Administered 2019-08-30: 500 mL via INTRAVENOUS

## 2019-08-30 MED ORDER — LIDOCAINE HCL (PF) 1 % IJ SOLN: 30.0000 mL | INTRAMUSCULAR | Status: DC | PRN

## 2019-08-30 MED ORDER — LACTATED RINGERS IV SOLN: 500.0000 mL | INTRAVENOUS | Status: DC | PRN

## 2019-08-30 NOTE — Discharge Summary (Addendum)
Postpartum Discharge Summary     Patient Name: Molly Evans DOB: 02/23/2000 MRN: [id number]  Date of admission: 08/30/2019 Delivery date:08/30/2019  Delivering provider: Fatima Blank A  Date of discharge: 09/01/2019  Admitting diagnosis: Labor and delivery indication for care or intervention [O75.9] Intrauterine pregnancy: [redacted]w[redacted]d    Secondary diagnosis:  Active Problems:   Labor and delivery indication for care or intervention  Additional problems: None    Discharge diagnosis: Term Pregnancy Delivered                                              Post partum procedures: None Augmentation: N/A Complications: None  Hospital course: Onset of Labor With Vaginal Delivery      19y.o. yo G1P0 at 21w3das admitted in Latent Labor on 08/30/2019. On admission she was asymptomatic but screened positive for COVID-19. Patient had an uncomplicated labor course as follows:  Membrane Rupture Time/Date: 3:02 PM ,08/30/2019   Delivery Method:Vaginal, Spontaneous  Episiotomy: None  Lacerations:  Labial  Patient had an uncomplicated postpartum course.  She is ambulating, tolerating a regular diet, passing flatus, and urinating well. Patient is discharged home in stable condition on 09/01/19.  Newborn Data: Birth date:08/30/2019  Birth time:11:11 PM  Gender:Female  Living status:Living  Apgars:8 ,9  Weight:7 lb 6.9 oz (3.371 kg)   Magnesium Sulfate received: No BMZ received: No Rhophylac:No MMR:No T-DaP: declined Flu: No Transfusion:No  Physical exam  Vitals:   08/31/19 1030 08/31/19 1450 08/31/19 1920 09/01/19 0530  BP: 104/72 105/69 107/67 109/71  Pulse: 72 62 75 67  Resp: _0 Temp: 98.1 F (36.7 C) 98.2 F (36.8 C) 98.2 F (36.8 C) 98.1 F (36.7 C)  TempSrc: Oral Oral Oral Oral  SpO2:   100% 100%  Weight:      Height:       General: alert, cooperative and no distress Lochia: appropriate Uterine Fundus: firm Incision: N/A DVT Evaluation: No  evidence of DVT seen on physical exam. Labs: Lab Results  Component Value Date   WBC 8.0 08/30/2019   HGB 11.0 (L) 08/30/2019   HCT 34.6 (L) 08/30/2019   MCV 84.0 08/30/2019   PLT 218 08/30/2019   CMP Latest Ref Rng & Units 05/23/2018  Glucose 70 - 99 mg/dL 87  BUN 6 - 20 mg/dL 6  Creatinine 0.44 - 1.00 mg/dL 0.88  Sodium 135 - 145 mmol/L 140  Potassium 3.5 - 5.1 mmol/L 3.8  Chloride 98 - 111 mmol/L 109  CO2 22 - 32 mmol/L 25  Calcium 8.9 - 10.3 mg/dL 8.8(L)   EdFlavia Shippercore: Edinburgh Postnatal Depression Scale Screening Tool 08/31/2019  I have been able to laugh and see the funny side of things. 0  I have looked forward with enjoyment to things. 1  I have blamed myself unnecessarily when things went wrong. 0  I have been anxious or worried for no good reason. 0  I have felt scared or panicky for no good reason. 0  Things have been getting on top of me. 1  I have been so unhappy that I have had difficulty sleeping. 2  I have felt sad or miserable. 0  I have been so unhappy that I have been crying. 0  The thought of harming myself has occurred to me. 0  Edinburgh Postnatal Depression Scale Total  4    After visit meds:  Allergies as of 09/01/2019   No Known Allergies      Medication List     STOP taking these medications    Blood Pressure Cuff Misc       TAKE these medications    acetaminophen 325 MG tablet Commonly known as: Tylenol Take 2 tablets (650 mg total) by mouth every 6 (six) hours.   ibuprofen 600 MG tablet Commonly known as: ADVIL Take 1 tablet (600 mg total) by mouth every 8 (eight) hours as needed.   multivitamin-prenatal 27-0.8 MG Tabs tablet Take 1 tablet by mouth daily at 12 noon.         Discharge home in stable condition Infant Feeding: Bottle Infant Disposition:home with mother Discharge instruction: per After Visit Summary and Postpartum booklet. Activity: Advance as tolerated. Pelvic rest for 6 weeks.  Diet: routine  diet Future Appointments: Future Appointments  Date Time Provider [place]  10/04/2019  1:00 PM Lajean Manes, CNM [place] None  10/08/2019 12:40 PM [facility] [place] 2 [facility]   Follow up Visit:  Message sent 08/31/19 to [facility]  Please schedule this patient for a In person postpartum visit in 6 weeks with the following provider: Any provider. Additional Postpartum F/U: n/a   Low risk pregnancy complicated by:  n/a Delivery mode:  Vaginal, Spontaneous  Anticipated Birth Control:  Careers adviser of Supervision of Student:  I confirm that I have verified the information documented in the  resident's  note and that I have also personally reperformed the history, physical exam and all medical decision making activities.  I have verified that all services and findings are accurately documented in this student's note; and I agree with management and plan as outlined in the documentation. I have also made any necessary editorial changes.  Counseled mom on benefits of breastfeeding and COVID vaccine, especially given COVID positive on admission prior to delivery.   Randa Ngo, [facility] [place] [facility] 09/01/2019 11:04 AM   Astrid Drafts, Gildardo Cranker, MD OB Fellow, Faculty Practice 09/01/2019 11:03 AM
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
## 2019-08-30 NOTE — MAU Note (Signed)
ctxs started this morning around 0800, "back to back". Bled a little earlier. Was 2 cm this morning in office. No LOF.  Asking for something for pain, so she can sleep

## 2019-08-30 NOTE — Anesthesia Preprocedure Evaluation (Signed)
Anesthesia Evaluation  Patient identified by MRN, date of birth, ID band Patient awake    Reviewed: Allergy & Precautions, H&P , NPO status , Patient's Chart, lab work & pertinent test results  History of Anesthesia Complications Negative for: history of anesthetic complications  Airway Mallampati: II  TM Distance: >3 FB Neck ROM: full    Dental no notable dental hx.    Pulmonary neg pulmonary ROS, former smoker,    Pulmonary exam normal        Cardiovascular negative cardio ROS Normal cardiovascular exam Rhythm:regular Rate:Normal     Neuro/Psych negative neurological ROS  negative psych ROS   GI/Hepatic negative GI ROS, Neg liver ROS,   Endo/Other  negative endocrine ROS  Renal/GU negative Renal ROS  negative genitourinary   Musculoskeletal   Abdominal   Peds  Hematology negative hematology ROS (+)   Anesthesia Other Findings  COVID-19 POSITIVE, asymptomatic  Reproductive/Obstetrics (+) Pregnancy                            Anesthesia Physical Anesthesia Plan  ASA: II  Anesthesia Plan: Epidural   Post-op Pain Management:    Induction:   PONV Risk Score and Plan: 2  Airway Management Planned:   Additional Equipment:   Intra-op Plan:   Post-operative Plan:   Informed Consent: I have reviewed the patients History and Physical, chart, labs and discussed the procedure including the risks, benefits and alternatives for the proposed anesthesia with the patient or authorized representative who has indicated his/her understanding and acceptance.       Plan Discussed with:   Anesthesia Plan Comments:         Anesthesia Quick Evaluation

## 2019-08-30 NOTE — Progress Notes (Signed)
Molly Evans is a 19 y.o. G1P0 at [redacted]w[redacted]d by LMP admitted for early labor with non-reactive NST post-dates.  Subjective: Patient SROM with clear fluid, and began having very painful contractions, s/p epidural. Comfortable after epidural now.  Objective: BP 111/66   Pulse 75   Temp 97.8 F (36.6 C) (Oral)   Resp 16   Ht 5\' 2"  (1.575 m)   Wt 69.9 kg   LMP 11/20/2018 (Exact Date)   SpO2 100%   BMI 28.17 kg/m  No intake/output data recorded. No intake/output data recorded.  FHT:  FHR: 135 bpm, variability: moderate,  accelerations:  Abscent,  decelerations:  Absent UC:   regular, every 2-4 minutes SVE:   Dilation: 4.5 Effacement (%): 90 Station: -1 Exam by:: Dr. [email or web address]  Labs: Lab Results  Component Value Date   WBC 8.0 08/30/2019   HGB 11.0 (L) 08/30/2019   HCT 34.6 (L) 08/30/2019   MCV 84.0 08/30/2019   PLT 218 08/30/2019    Assessment / Plan: Spontaneous labor, progressing normally. Cat I labs.  Labor: Progressing normally Preeclampsia:  N/A Fetal Wellbeing:  Category I Pain Control:  Epidural I/D:  COVID POS; GBS Neg Anticipated MOD:  NSVD  10/30/2019, DO 08/30/2019, 4:41 PM

## 2019-08-30 NOTE — Progress Notes (Addendum)
40w ROB presents for NST only had scheduled virtual ROB yesterday.  CH:[id number] 3-5 mins apart, bloody show no leaking.

## 2019-08-30 NOTE — Anesthesia Procedure Notes (Signed)
Epidural ?Patient location during procedure: OB ? ?Staffing ?Anesthesiologist: Jordi Kamm E, MD ?Performed: anesthesiologist  ? ?Preanesthetic Checklist ?Completed: patient identified, IV checked, risks and benefits discussed, monitors and equipment checked, pre-op evaluation and timeout performed ? ?Epidural ?Patient position: sitting ?Prep: DuraPrep ?Patient monitoring: heart rate, continuous pulse ox and blood pressure ?Approach: midline ?Location: L3-L4 ?Injection technique: LOR air ? ?Needle:  ?Needle type: Tuohy  ?Needle gauge: 17 G ?Needle length: 9 cm ?Needle insertion depth: 6 cm ?Catheter type: closed end flexible ?Catheter size: 19 Gauge ?Catheter at skin depth: 11 cm ?Test dose: negative ? ?Assessment ?Events: blood not aspirated, injection not painful, no injection resistance, no paresthesia and negative IV test ? ?Additional Notes ?Reason for block:procedure for pain ? ? ? ?

## 2019-08-30 NOTE — Progress Notes (Signed)
Patient was assessed and managed by nursing staff during this encounter. I have reviewed the chart and agree with the documentation and plan. I have also made any necessary editorial changes.  Coral Ceo, MD 08/30/2019 10:58 AM

## 2019-08-30 NOTE — MAU Provider Note (Signed)
None      S: [name] is a 19 y.o. G1P0 at [redacted]w[redacted]d  who presents to MAU today complaining contractions back-to-back since this morning.  she endorses scant vaginal bleeding. She denies LOF. She reports normal fetal movement.  Seen at [facility] and was found to be 2 cm dilated.  O: BP (!) 102/46 (BP Location: Right Arm)    Pulse 85    Temp 98.2 F (36.8 C)    Resp 18    Ht 5\' 2"  (1.575 m)    Wt 70.1 kg    LMP 11/20/2018 (Exact Date)    SpO2 100%    BMI 28.26 kg/m  GENERAL: Well-developed, well-nourished female in.  Moderate distress with contractions.  HEAD: Normocephalic, atraumatic.  CHEST: Normal effort of breathing, regular heart rate ABDOMEN: Soft, nontender, gravid  Cervical exam:  Dilation: 3.5 Effacement (%): 80 Cervical Position: Middle Station: -1 Presentation: Vertex Exam by:: [email or web address], RN   Fetal Monitoring: Baseline: 140 Variability: Moderate Accelerations: 10x10 Decelerations: None Contractions: Every 2-5  Exchanged from 2 in the office to 3.5 cm.  Fetal heart rate still nonreactive but overall reassuring.  A: SIUP at [redacted]w[redacted]d  Active labor Nonreactive NST  P: Admit to labor and delivery. Anesthesia, analgesia as needed.  [redacted]w[redacted]d, Katrinka Blazing, [place] 08/30/2019 12:39 PM

## 2019-08-31 ENCOUNTER — Encounter (HOSPITAL_COMMUNITY): Payer: Self-pay | Admitting: Obstetrics & Gynecology

## 2019-08-31 LAB — RPR: RPR Ser Ql: NONREACTIVE

## 2019-08-31 MED ORDER — SENNOSIDES-DOCUSATE SODIUM 8.6-50 MG PO TABS
2.0000 | ORAL_TABLET | ORAL | Status: DC
  Administered 2019-08-31: 2 via ORAL
  Filled 2019-08-31: qty 2

## 2019-08-31 MED ORDER — ACETAMINOPHEN 325 MG PO TABS
650.0000 mg | ORAL_TABLET | ORAL | Status: DC | PRN
  Administered 2019-09-01: 650 mg via ORAL
  Filled 2019-08-31: qty 2

## 2019-08-31 MED ORDER — WITCH HAZEL-GLYCERIN EX PADS: 1.0000 "application " | MEDICATED_PAD | CUTANEOUS | Status: DC | PRN

## 2019-08-31 MED ORDER — PRENATAL MULTIVITAMIN CH
1.0000 | ORAL_TABLET | Freq: Every day | ORAL | Status: DC
  Administered 2019-08-31: 1 via ORAL
  Filled 2019-08-31: qty 1

## 2019-08-31 MED ORDER — SIMETHICONE 80 MG PO CHEW: 80.0000 mg | CHEWABLE_TABLET | ORAL | Status: DC | PRN

## 2019-08-31 MED ORDER — ONDANSETRON HCL 4 MG/2ML IJ SOLN: 4.0000 mg | INTRAMUSCULAR | Status: DC | PRN

## 2019-08-31 MED ORDER — ONDANSETRON HCL 4 MG PO TABS: 4.0000 mg | ORAL_TABLET | ORAL | Status: DC | PRN

## 2019-08-31 MED ORDER — DIBUCAINE (PERIANAL) 1 % EX OINT: 1.0000 "application " | TOPICAL_OINTMENT | CUTANEOUS | Status: DC | PRN

## 2019-08-31 MED ORDER — BENZOCAINE-MENTHOL 20-0.5 % EX AERO: 1.0000 "application " | INHALATION_SPRAY | CUTANEOUS | Status: DC | PRN

## 2019-08-31 MED ORDER — TETANUS-DIPHTH-ACELL PERTUSSIS 5-2.5-18.5 LF-MCG/0.5 IM SUSP: 0.5000 mL | Freq: Once | INTRAMUSCULAR | Status: DC

## 2019-08-31 MED ORDER — DIPHENHYDRAMINE HCL 25 MG PO CAPS
25.0000 mg | ORAL_CAPSULE | Freq: Four times a day (QID) | ORAL | Status: DC | PRN
  Administered 2019-08-31: 25 mg via ORAL
  Filled 2019-08-31: qty 1

## 2019-08-31 MED ORDER — IBUPROFEN 600 MG PO TABS
600.0000 mg | ORAL_TABLET | Freq: Four times a day (QID) | ORAL | Status: DC
  Administered 2019-08-31 – 2019-09-01 (×5): 600 mg via ORAL
  Filled 2019-08-31 (×5): qty 1

## 2019-08-31 MED ORDER — COCONUT OIL OIL: 1.0000 "application " | TOPICAL_OIL | Status: DC | PRN

## 2019-08-31 NOTE — Progress Notes (Signed)
POSTPARTUM PROGRESS NOTE  Subjective: Molly Evans is a 19 y.o. G1P1001 at PPD#1 s/p NSVD at [redacted]w[redacted]d.  She reports she doing well. No acute events overnight. She denies any problems with ambulating, voiding or po intake. Denies nausea or vomiting. She has passed flatus. Pain is well controlled.  Lochia is less than menses. Strong desire to take a shower.  Objective: Blood pressure 104/72, pulse 72, temperature 98.1 F (36.7 C), temperature source Oral, resp. rate 18, height 5\' 2"  (1.575 m), weight 69.9 kg, last menstrual period 11/20/2018, SpO2 99 %, unknown if currently breastfeeding.  Physical Exam:  General: alert, cooperative and no distress Chest: no respiratory distress Abdomen: soft, non-tender  Uterine Fundus: firm, appropriately tender Extremities: No calf swelling or tenderness  no edema  Recent Labs    08/30/19 1305  HGB 11.0*  HCT 34.6*    Assessment/Plan: Molly Evans is a 19 y.o. G1P1001 s/p NSVD at [redacted]w[redacted]d for non-reactive NST and postdates.  Routine Postpartum Care: Doing well, pain well-controlled.  -- Continue routine care, lactation support  -- Contraception: Undecided -- Feeding: Bottle  Dispo: Plan for discharge tomorrow pending normal postpartum course.  [redacted]w[redacted]d, MD PGY-3 Family Medicine Resident

## 2019-08-31 NOTE — Clinical Social Work Maternal (Signed)
CLINICAL SOCIAL WORK MATERNAL/CHILD NOTE  Patient Details  Name: Molly Evans MRN: [id number] Date of Birth: 05/20/2000  Date:  09/10/19  Clinical Social Worker Initiating Note:  Lear Ng Date/Time: Initiated:  08/31/19/1132     Child's Name:  Molly Evans   Biological Parents:  Mother, Father (Knowledge Hunsucker DOB: 06/12/1992)   Need for Interpreter:  None   Reason for Referral:  Current Substance Use/Substance Use During Pregnancy     Address:  [address] [place] [address]    Phone number:  [phone] (home)     Additional phone number:   Household Members/Support Persons (HM/SP):   Household Member/Support Person 1   HM/SP Name Relationship DOB or Age  HM/SP -1 Junie Bame MOB's mother    HM/SP -2        HM/SP -3        HM/SP -4        HM/SP -5        HM/SP -6        HM/SP -7        HM/SP -8          Natural Supports (not living in the home):  Spouse/significant other   Professional Supports: Other (Comment) Risk analyst)   Employment: Unemployed   Type of Work:     Education:  Engineer, agricultural   Homebound arranged:    Surveyor, quantity Resources:  Medicaid   Other Resources:  Sales executive  , WIC   Cultural/Religious Considerations Which May Impact Care:    Strengths:  Ability to meet basic needs  , Home prepared for child     Psychotropic Medications:         Pediatrician:       Pediatrician List:   [facility] [place]    [place]    [place] [facility]      Pediatrician Fax Number:    Risk Factors/Current Problems:  Substance Use     Cognitive State:  Able to Concentrate  , Linear Thinking     Mood/Affect:  Calm  , Comfortable     CSW Assessment: CSW received consult for history of THC use. CSW spoke with MOB via telephone due to contact precautions to offer support and complete assessment.    MOB welcoming of CSW phone call and was pleasant throughout  phone call. CSW inquired about how MOB and baby were doing to which she stated they were good but ready to be home. CSW validated these feelings. MOB reported she currently lives with her mother and that FOB is involved. MOB confirmed she receives both [facility] and food stamps and is aware she needs to follow up and update them of her delivery. CSW inquired about MOB's mental health history to which MOB denied having any. MOB reported she is aware of PPD. CSW provided brief education regarding the baby blues period vs. perinatal mood disorders. MOB denied any current SI, HI or DV and reported having a good support system.   CSW inquired about MOB's substance use history to which MOB acknowledged use of marijuana during pregnancy to help with her appetite. MOB reported last use was on Tuesday. CSW informed MOB of Hospital Drug Policy and stated UDS and CDS were still pending but that a CPS report would be made, if warranted. CSW explained what process may look like should results come back positive. MOB denied any further questions or concerns  regarding policy or potential report.   MOB stated she had all essential items for infant once discharged and that infant would be sleeping in their own bad once home. CSW provided review of Sudden Infant Death Syndrome (SIDS) precautions and safe sleeping habits.    CSW to continue to monitor UDS and CDS and make CPS report, if warranted.    CSW Plan/Description:  No Further Intervention Required/No Barriers to Discharge, Sudden Infant Death Syndrome (SIDS) Education, Perinatal Mood and Anxiety Disorder (PMADs) Education, Hospital Drug Screen Policy Information, CSW Will Continue to Monitor Umbilical Cord Tissue Drug Screen Results and Make Report if Warranted    Oaklyn Jakubek, LCSW 08/31/2019, 11:50 AM  

## 2019-08-31 NOTE — Anesthesia Postprocedure Evaluation (Signed)
Anesthesia Post Note  Patient: Molly Evans  Procedure(s) Performed: AN AD HOC LABOR EPIDURAL     Patient location during evaluation: Mother Baby Anesthesia Type: Epidural Level of consciousness: awake and alert Pain management: pain level controlled Vital Signs Assessment: post-procedure vital signs reviewed and stable Respiratory status: spontaneous breathing, nonlabored ventilation and respiratory function stable Cardiovascular status: stable Postop Assessment: no headache, no backache and epidural receding Anesthetic complications: no   No complications documented.  Last Vitals:  Vitals:   08/31/19 0601 08/31/19 0757  BP: 125/65 104/72  Pulse: 92 72  Resp: 18 18  Temp: 37.1 C 36.7 C  SpO2: 99%     Last Pain:  Vitals:   08/31/19 0757  TempSrc: Oral  PainSc:    Pain Goal:                   Junious Silk

## 2019-09-01 ENCOUNTER — Other Ambulatory Visit (HOSPITAL_COMMUNITY): Payer: Medicaid Other

## 2019-09-01 MED ORDER — IBUPROFEN 600 MG PO TABS: 600.0000 mg | ORAL_TABLET | Freq: Three times a day (TID) | ORAL | 0 refills | Status: DC | PRN

## 2019-09-01 MED ORDER — ACETAMINOPHEN 325 MG PO TABS: 650.0000 mg | ORAL_TABLET | Freq: Four times a day (QID) | ORAL | Status: DC

## 2019-09-01 MED ORDER — IBUPROFEN 600 MG PO TABS: 600.0000 mg | ORAL_TABLET | Freq: Four times a day (QID) | ORAL | 0 refills | Status: DC

## 2019-09-01 NOTE — Discharge Instructions (Signed)
We recommend referring to [employer]) website for more information regarding the COVID vaccine with breastfeeding. Research shows that you are able to pass protective antibodies to baby via breastfeeding and pumped breastmilk. COVID vaccine is also recommended for breastfeeding mothers and can also provide protective antibodies to baby via breastfeeding.  Postpartum Care After Vaginal Delivery This sheet gives you information about how to care for yourself from the time you deliver your baby to up to 6-12 weeks after delivery (postpartum period). Your health care provider may also give you more specific instructions. If you have problems or questions, contact your health care provider. Follow these instructions at home: Vaginal bleeding  It is normal to have vaginal bleeding (lochia) after delivery. Wear a sanitary pad for vaginal bleeding and discharge. ? During the first week after delivery, the amount and appearance of lochia is often similar to a menstrual period. ? Over the next few weeks, it will gradually decrease to a dry, yellow-brown discharge. ? For most women, lochia stops completely by 4-6 weeks after delivery. Vaginal bleeding can vary from woman to woman.  Change your sanitary pads frequently. Watch for any changes in your flow, such as: ? A sudden increase in volume. ? A change in color. ? Large blood clots.  If you pass a blood clot from your vagina, save it and call your health care provider to discuss. Do not flush blood clots down the toilet before talking with your health care provider.  Do not use tampons or douches until your health care provider says this is safe.  If you are not breastfeeding, your period should return 6-8 weeks after delivery. If you are feeding your child breast milk only (exclusive breastfeeding), your period may not return until you stop breastfeeding. Perineal care  Keep the area between the vagina and the anus  (perineum) clean and dry as told by your health care provider. Use medicated pads and pain-relieving sprays and creams as directed.  If you had a cut in the perineum (episiotomy) or a tear in the vagina, check the area for signs of infection until you are healed. Check for: ? More redness, swelling, or pain. ? Fluid or blood coming from the cut or tear. ? Warmth. ? Pus or a bad smell.  You may be given a squirt bottle to use instead of wiping to clean the perineum area after you go to the bathroom. As you start healing, you may use the squirt bottle before wiping yourself. Make sure to wipe gently.  To relieve pain caused by an episiotomy, a tear in the vagina, or swollen veins in the anus (hemorrhoids), try taking a warm sitz bath 2-3 times a day. A sitz bath is a warm water bath that is taken while you are sitting down. The water should only come up to your hips and should cover your buttocks. Breast care  Within the first few days after delivery, your breasts may feel heavy, full, and uncomfortable (breast engorgement). Milk may also leak from your breasts. Your health care provider can suggest ways to help relieve the discomfort. Breast engorgement should go away within a few days.  If you are breastfeeding: ? Wear a bra that supports your breasts and fits you well. ? Keep your nipples clean and dry. Apply creams and ointments as told by your health care provider. ? You may need to use breast pads to absorb milk that leaks from your breasts. ? You may have uterine contractions  every time you breastfeed for up to several weeks after delivery. Uterine contractions help your uterus return to its normal size. ? If you have any problems with breastfeeding, work with your health care provider or Advertising copywriter.  If you are not breastfeeding: ? Avoid touching your breasts a lot. Doing this can make your breasts produce more milk. ? Wear a good-fitting bra and use cold packs to help with  swelling. ? Do not squeeze out (express) milk. This causes you to make more milk. Intimacy and sexuality  Ask your health care provider when you can engage in sexual activity. This may depend on: ? Your risk of infection. ? How fast you are healing. ? Your comfort and desire to engage in sexual activity.  You are able to get pregnant after delivery, even if you have not had your period. If desired, talk with your health care provider about methods of birth control (contraception). Medicines  Take over-the-counter and prescription medicines only as told by your health care provider.  If you were prescribed an antibiotic medicine, take it as told by your health care provider. Do not stop taking the antibiotic even if you start to feel better. Activity  Gradually return to your normal activities as told by your health care provider. Ask your health care provider what activities are safe for you.  Rest as much as possible. Try to rest or take a nap while your baby is sleeping. Eating and drinking   Drink enough fluid to keep your urine pale yellow.  Eat high-fiber foods every day. These may help prevent or relieve constipation. High-fiber foods include: ? Whole grain cereals and breads. ? Brown rice. ? Beans. ? Fresh fruits and vegetables.  Do not try to lose weight quickly by cutting back on calories.  Take your prenatal vitamins until your postpartum checkup or until your health care provider tells you it is okay to stop. Lifestyle  Do not use any products that contain nicotine or tobacco, such as cigarettes and e-cigarettes. If you need help quitting, ask your health care provider.  Do not drink alcohol, especially if you are breastfeeding. General instructions  Keep all follow-up visits for you and your baby as told by your health care provider. Most women visit their health care provider for a postpartum checkup within the first 3-6 weeks after delivery. Contact a health  care provider if:  You feel unable to cope with the changes that your child brings to your life, and these feelings do not go away.  You feel unusually sad or worried.  Your breasts become red, painful, or hard.  You have a fever.  You have trouble holding urine or keeping urine from leaking.  You have little or no interest in activities you used to enjoy.  You have not breastfed at all and you have not had a menstrual period for 12 weeks after delivery.  You have stopped breastfeeding and you have not had a menstrual period for 12 weeks after you stopped breastfeeding.  You have questions about caring for yourself or your baby.  You pass a blood clot from your vagina. Get help right away if:  You have chest pain.  You have difficulty breathing.  You have sudden, severe leg pain.  You have severe pain or cramping in your lower abdomen.  You bleed from your vagina so much that you fill more than one sanitary pad in one hour. Bleeding should not be heavier than your heaviest period.  You develop a severe headache.  You faint.  You have blurred vision or spots in your vision.  You have bad-smelling vaginal discharge.  You have thoughts about hurting yourself or your baby. If you ever feel like you may hurt yourself or others, or have thoughts about taking your own life, get help right away. You can go to the nearest emergency department or call:  Your local emergency services (911 in the [place]  A suicide crisis helpline, such as the National Suicide Prevention Lifeline at 913-335-6377. This is open 24 hours a day. Summary  The period of time right after you deliver your newborn up to 6-12 weeks after delivery is called the postpartum period.  Gradually return to your normal activities as told by your health care provider.  Keep all follow-up visits for you and your baby as told by your health care provider. This information is not intended to replace advice given  to you by your health care provider. Make sure you discuss any questions you have with your health care provider. Document Revised: 01/07/2017 Document Reviewed: 10/18/2016 Elsevier Patient Education  2020 [employer].

## 2019-09-03 ENCOUNTER — Telehealth: Payer: Self-pay | Admitting: *Deleted

## 2019-09-03 ENCOUNTER — Inpatient Hospital Stay (HOSPITAL_COMMUNITY): Payer: Medicaid Other

## 2019-09-03 ENCOUNTER — Inpatient Hospital Stay (HOSPITAL_COMMUNITY)
Admission: AD | Admit: 2019-09-03 | Payer: Medicaid Other | Source: Home / Self Care | Admitting: Obstetrics and Gynecology

## 2019-09-03 NOTE — Telephone Encounter (Signed)
Returned patient's call. Transition of care assessment completed:  Transition Care Management Follow-up Telephone Call  . Medicaid Managed Care Transition Call Status:MM [facility] Call Made  . Date of discharge and from where: [facility], 09/01/19  . How have you been since you were released from the hospital? "ok"  . Any questions or concerns? No  Items Reviewed: Marland Kitchen Did the pt receive and understand the discharge instructions provided? Yes  . Medications obtained and verified? Yes  . Any new allergies since your discharge? No  . Dietary orders reviewed? Yes . Do you have support at home?   Yes, family  Functional Questionnaire: (I = Independent and D = Dependent)  ADLs: Independent Bathing/Dressing:Independent Meal Prep: Independent Eating: Independent Maintaining continence: Independent Transferring/Ambulation: Independent Managing Meds: Independent   Follow up appointments reviewed:   PCP Hospital f/u appt confirmed? No  Patient to call Gwinda Passe, NP on 09/03/19 for follow up appot  Specialist Hospital f/u appt confirmed? Yes Scheduled to see Steward Drone on 10/03/19 @ 1300 .  Are transportation arrangements needed? No   If their condition worsens, is the pt aware to call PCP or go to the EmergencyDept.? yes  Was the patient provided with contact information for the PCP's office or ED? Yes   Was to pt encouraged to call back with questions or concerns? Yes  Burnard Bunting, RN, BSN, CCRN Patient Engagement Center [phone]

## 2019-09-03 NOTE — Telephone Encounter (Signed)
Medicaid Managed Care team Transition of Care Assessment outreach attempt #1 made today. Unable to reach patient. HIPPA compliant voice message left requesting a return call. The patient has also been enrolled in an automated discharge follow up call series and will receive two outreach attempts for transition of care assessment. Contact information has been left for the patient and the Medicaid Managed Care team is available to provide assistance to the patient at any time.  ° °Katrice Michell Kader, RN, BSN, CCRN °Patient Engagement Center °[phone] ° °

## 2019-10-04 ENCOUNTER — Encounter: Payer: Self-pay | Admitting: Certified Nurse Midwife

## 2019-10-04 ENCOUNTER — Other Ambulatory Visit: Payer: Self-pay

## 2019-10-04 ENCOUNTER — Ambulatory Visit (INDEPENDENT_AMBULATORY_CARE_PROVIDER_SITE_OTHER): Payer: Medicaid Other | Admitting: Certified Nurse Midwife

## 2019-10-04 NOTE — Patient Instructions (Signed)

## 2019-10-04 NOTE — Progress Notes (Signed)
..   Post Partum Visit Note  Molly Evans is a 19 y.o. G35P1001 female who presents for a postpartum visit. She is 5 weeks postpartum following a normal spontaneous vaginal delivery.  I have fully reviewed the prenatal and intrapartum course. The delivery was at 40.3 gestational weeks.  Anesthesia: epidural. Postpartum course has been good. Baby is doing well. Baby is feeding by bottle - Gerber Gentle. Bleeding no bleeding. Bowel function is normal. Bladder function is normal. Patient is sexually active. Contraception method is none. Postpartum depression screening: negative.   The pregnancy intention screening data noted above was reviewed. Potential methods of contraception were discussed. The patient elected to proceed with No Method - Other Reason.    Edinburgh Postnatal Depression Scale - 10/04/19 1315      Edinburgh Postnatal Depression Scale:  In the Past 7 Days   I have been able to laugh and see the funny side of things. 0    I have looked forward with enjoyment to things. 0    I have blamed myself unnecessarily when things went wrong. 0    I have been anxious or worried for no good reason. 0    I have felt scared or panicky for no good reason. 0    Things have been getting on top of me. 0    I have been so unhappy that I have had difficulty sleeping. 0    I have felt sad or miserable. 0    I have been so unhappy that I have been crying. 0    The thought of harming myself has occurred to me. 0    Edinburgh Postnatal Depression Scale Total 0            The following portions of the patient's history were reviewed and updated as appropriate: allergies, current medications, past medical history, past surgical history and problem list.  Review of Systems A comprehensive review of systems was negative.    Objective:  Blood pressure 135/87, pulse 97, height 5\' 2"  (1.575 m), weight 122 lb (55.3 kg), last menstrual period 11/20/2018, not currently breastfeeding.  General:   alert, cooperative and no distress   Breasts:  inspection negative, no nipple discharge or bleeding, no masses or nodularity palpable  Lungs: clear to auscultation bilaterally  Heart:  regular rate and rhythm  Abdomen: soft, non-tender; bowel sounds normal; no masses,  no organomegaly   Vulva:  not evaluated  Vagina: not evaluated  Cervix:  not evaluated  Corpus: not examined  Adnexa:  not evaluated  Rectal Exam: Not performed.        Assessment:    Normal postpartum exam. Pap smear not done at today's visit- patient <42 yo.  Educated and discussed birth control options - patient does not want to be on birth control, encouraged to ovulation track to help prevent pregnancy.  Borderline BP in office today, patient denies s/s PEC. Patient reports fam hx of HTN. Discussed with patient BP check in 1 week to evaluate BP normalizing.   Plan:   Essential components of care per ACOG recommendations:  1.  Mood and well being: Patient with negative depression screening today. Reviewed local resources for support.  - Patient does not use tobacco.  - hx of drug use? Yes , discussed support systems  2. Infant care and feeding:  -Patient currently breastmilk feeding? No  -Social determinants of health (SDOH) reviewed in EPIC. No concerns  3. Sexuality, contraception and birth spacing - Patient does  not want a pregnancy in the next year.  Desired family size is 2 children.  - Reviewed forms of contraception in tiered fashion. Patient desired no method today.   - Discussed birth spacing of 18 months  4. Sleep and fatigue -Encouraged family/partner/community support of 4 hrs of uninterrupted sleep to help with mood and fatigue  5. Physical Recovery  - Discussed patients delivery and complications - Patient has urinary incontinence? No - Patient is safe to resume physical and sexual activity   Sharyon Cable, CNM [facility] [employer], [facility]

## 2019-10-05 ENCOUNTER — Other Ambulatory Visit: Payer: Self-pay | Admitting: Obstetrics

## 2019-10-05 DIAGNOSIS — N6320 Unspecified lump in the left breast, unspecified quadrant: Secondary | ICD-10-CM

## 2019-10-08 ENCOUNTER — Other Ambulatory Visit: Payer: Medicaid Other

## 2019-10-11 ENCOUNTER — Telehealth: Payer: Medicaid Other

## 2019-10-12 ENCOUNTER — Telehealth: Payer: Self-pay

## 2019-10-12 NOTE — Telephone Encounter (Signed)
Attempted to call (x3) patient for My Chart Visit. No answer, and unable to leave voicemail.

## 2019-10-12 NOTE — Progress Notes (Signed)
No Show This encounter was created in error - please disregard. 

## 2020-02-17 ENCOUNTER — Encounter (HOSPITAL_COMMUNITY): Payer: Self-pay

## 2020-02-17 ENCOUNTER — Ambulatory Visit (HOSPITAL_COMMUNITY)
Admission: EM | Admit: 2020-02-17 | Discharge: 2020-02-17 | Disposition: A | Discharge status: Home / Self Care | Payer: Medicaid Other | Attending: Emergency Medicine | Admitting: Emergency Medicine

## 2020-02-17 ENCOUNTER — Other Ambulatory Visit: Payer: Self-pay

## 2020-02-17 DIAGNOSIS — Z3202 Encounter for pregnancy test, result negative: Secondary | ICD-10-CM | POA: Diagnosis not present

## 2020-02-17 DIAGNOSIS — N3000 Acute cystitis without hematuria: Secondary | ICD-10-CM | POA: Insufficient documentation

## 2020-02-17 DIAGNOSIS — B9689 Other specified bacterial agents as the cause of diseases classified elsewhere: Secondary | ICD-10-CM | POA: Insufficient documentation

## 2020-02-17 DIAGNOSIS — Z113 Encounter for screening for infections with a predominantly sexual mode of transmission: Secondary | ICD-10-CM | POA: Diagnosis not present

## 2020-02-17 DIAGNOSIS — N760 Acute vaginitis: Secondary | ICD-10-CM | POA: Diagnosis not present

## 2020-02-17 LAB — POCT URINALYSIS DIPSTICK, ED / UC
Bilirubin Urine: NEGATIVE
Glucose, UA: NEGATIVE mg/dL
Hgb urine dipstick: NEGATIVE
Ketones, ur: NEGATIVE mg/dL
Leukocytes,Ua: NEGATIVE
Nitrite: POSITIVE — AB
Protein, ur: NEGATIVE mg/dL
Specific Gravity, Urine: >=1.03 (ref 1.005–1.030)
Urobilinogen, UA: 0.2 mg/dL (ref 0.0–1.0)
pH: 7 (ref 5.0–8.0)

## 2020-02-17 LAB — POC URINE PREG, ED: Preg Test, Ur: NEGATIVE

## 2020-02-17 MED ORDER — NITROFURANTOIN MONOHYD MACRO 100 MG PO CAPS
100.0000 mg | ORAL_CAPSULE | Freq: Two times a day (BID) | ORAL | 0 refills | Status: DC
Start: 2020-02-17 — End: 2020-05-09

## 2020-02-17 MED ORDER — CLINDAMYCIN PHOSPHATE 100 MG VA SUPP: 100.0000 mg | Freq: Every day | VAGINAL | 0 refills | Status: DC
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
## 2020-02-17 NOTE — Discharge Instructions (Signed)
-  Macrobid twice daily for 5 days. You can take this with food. This is the treatment for your UTI. -Clindamycin vaginal suppositories, one daily for 3 days. This is the treatment for BV.

## 2020-02-17 NOTE — ED Triage Notes (Signed)
Pt present urinary frequency with possible pregnant. Symptoms started week ago. Pt state her urine is cloudy with some discharge.

## 2020-02-19 ENCOUNTER — Telehealth (HOSPITAL_COMMUNITY): Payer: Self-pay | Admitting: Emergency Medicine

## 2020-02-19 LAB — CERVICOVAGINAL ANCILLARY ONLY
Bacterial Vaginitis (gardnerella): POSITIVE — AB
Candida Glabrata: NEGATIVE
Candida Vaginitis: POSITIVE — AB
Chlamydia: NEGATIVE
Comment: NEGATIVE
Comment: NEGATIVE
Comment: NEGATIVE
Comment: NEGATIVE
Comment: NEGATIVE
Comment: NORMAL
Neisseria Gonorrhea: NEGATIVE
Trichomonas: NEGATIVE

## 2020-02-20 LAB — URINE CULTURE: Culture: >=100000 — AB

## 2020-02-20 MED ORDER — FLUCONAZOLE 150 MG PO TABS: 150.0000 mg | ORAL_TABLET | Freq: Once | ORAL | 0 refills | Status: AC

## 2020-02-20 MED ORDER — SULFAMETHOXAZOLE-TRIMETHOPRIM 800-160 MG PO TABS: 1.0000 | ORAL_TABLET | Freq: Two times a day (BID) | ORAL | 0 refills | Status: AC

## 2020-05-09 ENCOUNTER — Ambulatory Visit (HOSPITAL_COMMUNITY)
Admission: RE | Admit: 2020-05-09 | Discharge: 2020-05-09 | Disposition: A | Discharge status: Home / Self Care | Payer: Medicaid Other | Source: Ambulatory Visit | Attending: Physician Assistant | Admitting: Physician Assistant

## 2020-05-09 ENCOUNTER — Other Ambulatory Visit: Payer: Self-pay

## 2020-05-09 ENCOUNTER — Encounter (HOSPITAL_COMMUNITY): Payer: Self-pay

## 2020-05-09 VITALS — BP 113/83 | HR 73 | Temp 98.9°F | Resp 16

## 2020-05-09 DIAGNOSIS — N898 Other specified noninflammatory disorders of vagina: Secondary | ICD-10-CM | POA: Diagnosis not present

## 2020-05-09 DIAGNOSIS — N390 Urinary tract infection, site not specified: Secondary | ICD-10-CM | POA: Insufficient documentation

## 2020-05-09 DIAGNOSIS — N939 Abnormal uterine and vaginal bleeding, unspecified: Secondary | ICD-10-CM | POA: Diagnosis not present

## 2020-05-09 LAB — POCT URINALYSIS DIPSTICK, ED / UC
Bilirubin Urine: NEGATIVE
Glucose, UA: NEGATIVE mg/dL
Ketones, ur: 15 mg/dL — AB
Nitrite: POSITIVE — AB
Protein, ur: 30 mg/dL — AB
Specific Gravity, Urine: >=1.03 (ref 1.005–1.030)
Urobilinogen, UA: 0.2 mg/dL (ref 0.0–1.0)
pH: 5.5 (ref 5.0–8.0)

## 2020-05-09 LAB — POC URINE PREG, ED: Preg Test, Ur: NEGATIVE

## 2020-05-09 MED ORDER — NITROFURANTOIN MONOHYD MACRO 100 MG PO CAPS: 100.0000 mg | ORAL_CAPSULE | Freq: Two times a day (BID) | ORAL | 0 refills | Status: AC

## 2020-05-09 NOTE — Discharge Instructions (Addendum)
Your urine shows evidence of infection so we will start nitrofurantoin to treat this.  We sent off your urine for culture and if any changes antibiotic we will be in touch.  We will call you if we need to start additional medications based on your other testing.  Please make sure you drink plenty of fluid.  If you have any worsening symptoms please return for further evaluation.
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
## 2020-05-09 NOTE — ED Triage Notes (Signed)
Pt presents with vaginal bleeding before cycle and vaginal discharge and irritation xs 1 week. States only has one sex partner.

## 2020-05-12 LAB — CERVICOVAGINAL ANCILLARY ONLY
Bacterial Vaginitis (gardnerella): NEGATIVE
Candida Glabrata: NEGATIVE
Candida Vaginitis: POSITIVE — AB
Chlamydia: NEGATIVE
Comment: NEGATIVE
Comment: NEGATIVE
Comment: NEGATIVE
Comment: NEGATIVE
Comment: NEGATIVE
Comment: NORMAL
Neisseria Gonorrhea: NEGATIVE
Trichomonas: NEGATIVE

## 2020-05-12 LAB — URINE CULTURE: Culture: >=100000 — AB

## 2020-05-14 ENCOUNTER — Telehealth (HOSPITAL_COMMUNITY): Payer: Self-pay | Admitting: Emergency Medicine

## 2020-05-14 MED ORDER — FLUCONAZOLE 150 MG PO TABS: 150.0000 mg | ORAL_TABLET | Freq: Once | ORAL | 0 refills | Status: AC

## 2020-07-19 ENCOUNTER — Encounter (HOSPITAL_COMMUNITY): Payer: Self-pay | Admitting: *Deleted

## 2020-07-19 ENCOUNTER — Ambulatory Visit (HOSPITAL_COMMUNITY)
Admission: EM | Admit: 2020-07-19 | Discharge: 2020-07-19 | Disposition: A | Discharge status: Home / Self Care | Payer: Medicaid Other | Attending: Family Medicine | Admitting: Family Medicine

## 2020-07-19 ENCOUNTER — Other Ambulatory Visit: Payer: Self-pay

## 2020-07-19 DIAGNOSIS — Z3202 Encounter for pregnancy test, result negative: Secondary | ICD-10-CM | POA: Diagnosis not present

## 2020-07-19 DIAGNOSIS — S39012A Strain of muscle, fascia and tendon of lower back, initial encounter: Secondary | ICD-10-CM | POA: Diagnosis not present

## 2020-07-19 LAB — POC URINE PREG, ED: Preg Test, Ur: NEGATIVE

## 2020-07-19 MED ORDER — CYCLOBENZAPRINE HCL 10 MG PO TABS: 10.0000 mg | ORAL_TABLET | Freq: Two times a day (BID) | ORAL | 0 refills | Status: DC | PRN

## 2020-07-19 MED ORDER — NAPROXEN 500 MG PO TABS: 500.0000 mg | ORAL_TABLET | Freq: Two times a day (BID) | ORAL | 0 refills | Status: DC
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
## 2020-08-01 ENCOUNTER — Ambulatory Visit (HOSPITAL_COMMUNITY): Payer: Self-pay

## 2020-09-28 ENCOUNTER — Encounter (HOSPITAL_COMMUNITY): Payer: Self-pay

## 2020-09-28 ENCOUNTER — Other Ambulatory Visit: Payer: Self-pay

## 2020-09-28 ENCOUNTER — Ambulatory Visit (HOSPITAL_COMMUNITY)
Admission: RE | Admit: 2020-09-28 | Discharge: 2020-09-28 | Disposition: A | Discharge status: Home / Self Care | Payer: Medicaid Other | Source: Ambulatory Visit | Attending: Emergency Medicine | Admitting: Emergency Medicine

## 2020-09-28 VITALS — BP 116/75 | HR 70 | Temp 98.9°F | Resp 16

## 2020-09-28 DIAGNOSIS — R319 Hematuria, unspecified: Secondary | ICD-10-CM | POA: Insufficient documentation

## 2020-09-28 DIAGNOSIS — N898 Other specified noninflammatory disorders of vagina: Secondary | ICD-10-CM | POA: Diagnosis not present

## 2020-09-28 DIAGNOSIS — N390 Urinary tract infection, site not specified: Secondary | ICD-10-CM | POA: Diagnosis not present

## 2020-09-28 LAB — POCT URINALYSIS DIPSTICK, ED / UC
Bilirubin Urine: NEGATIVE
Glucose, UA: NEGATIVE mg/dL
Ketones, ur: NEGATIVE mg/dL
Nitrite: POSITIVE — AB
Protein, ur: 30 mg/dL — AB
Specific Gravity, Urine: >=1.03 (ref 1.005–1.030)
Urobilinogen, UA: 0.2 mg/dL (ref 0.0–1.0)
pH: 5.5 (ref 5.0–8.0)

## 2020-09-28 LAB — POC URINE PREG, ED: Preg Test, Ur: NEGATIVE

## 2020-09-28 IMAGING — US US MFM OB COMP +14 WKS
1 series · 13 of 28 positions shown · non-contrast
Comparison: none

[Series 1: us mfm ob comp +14 wks · 58 acquisitions, 13 frames shown]
[im 3/58]
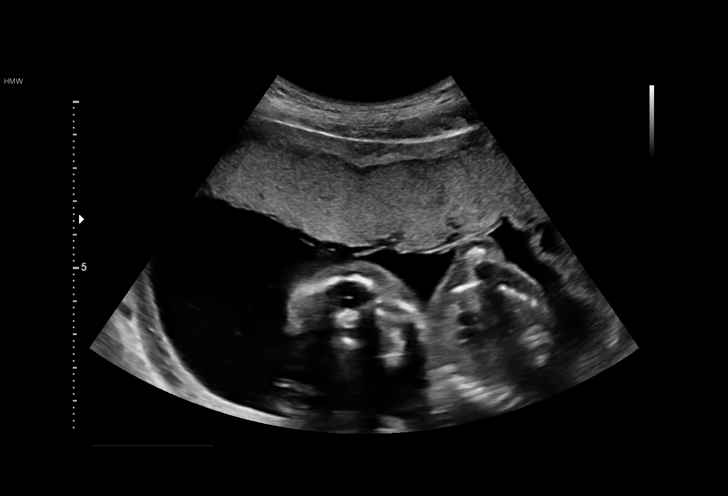
[im 7/58]
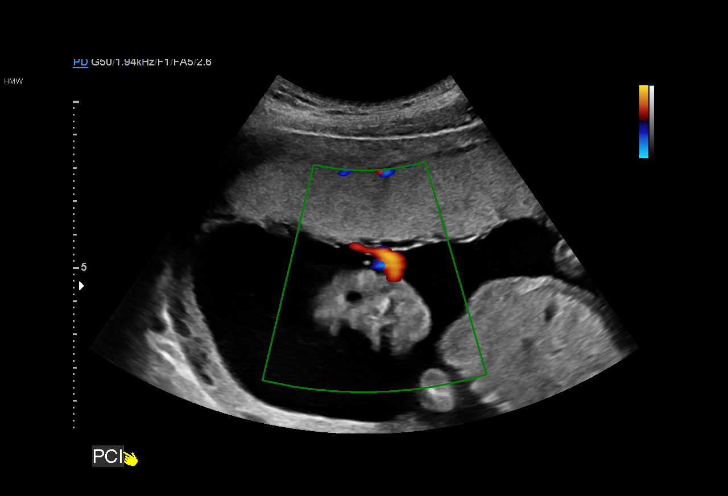
[im 11/58]
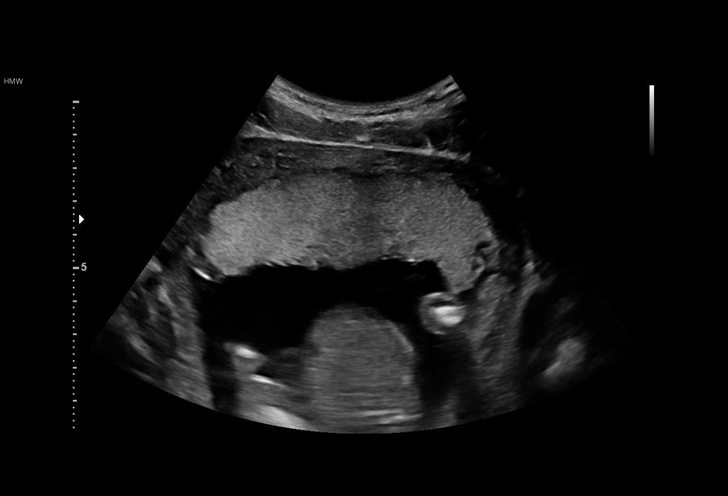
[im 15/58]
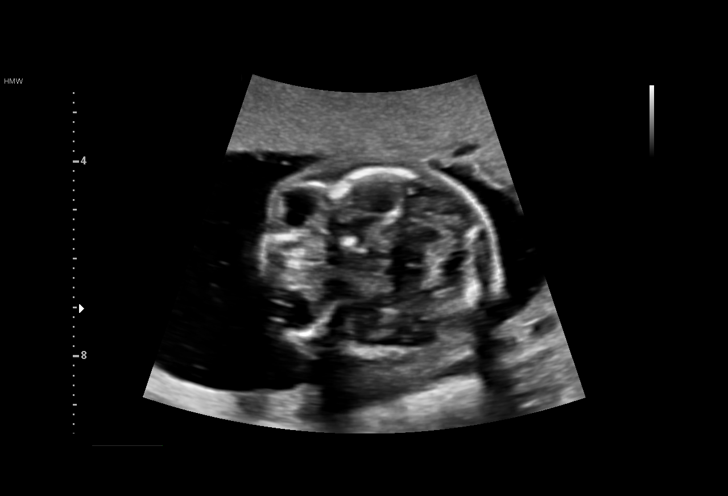
[im 20/58]
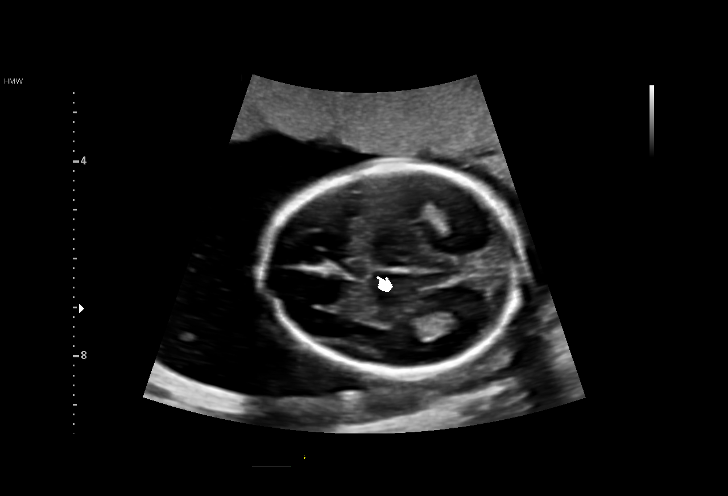
[im 24/58]
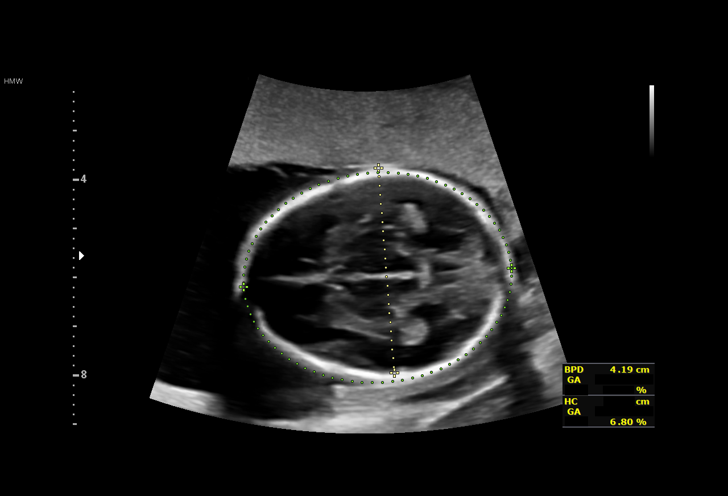
[im 30/58]
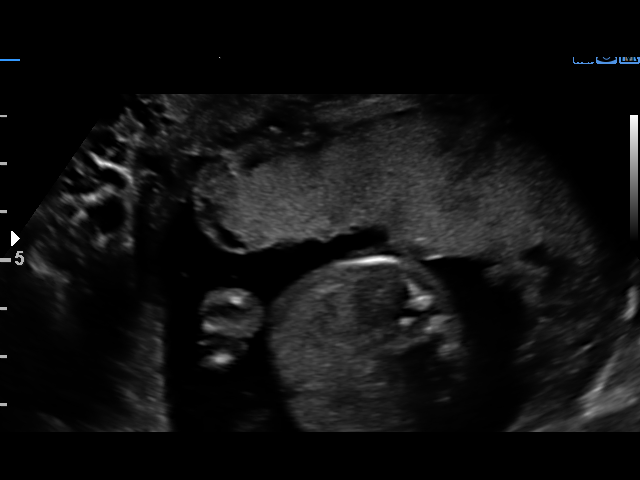
[im 34/58]
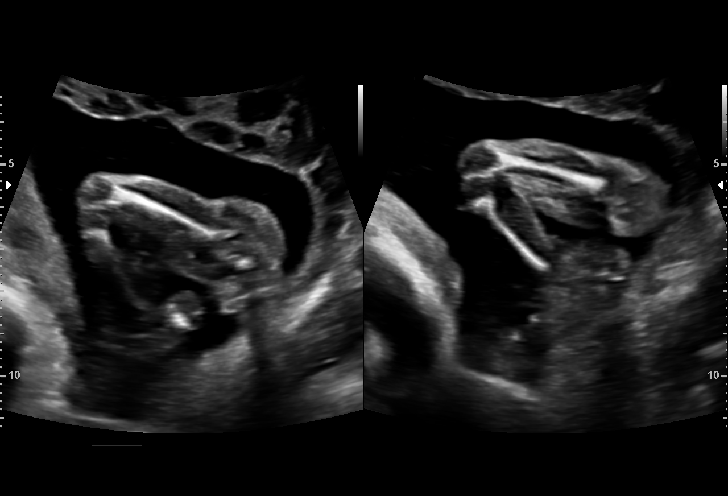
[im 39/58]
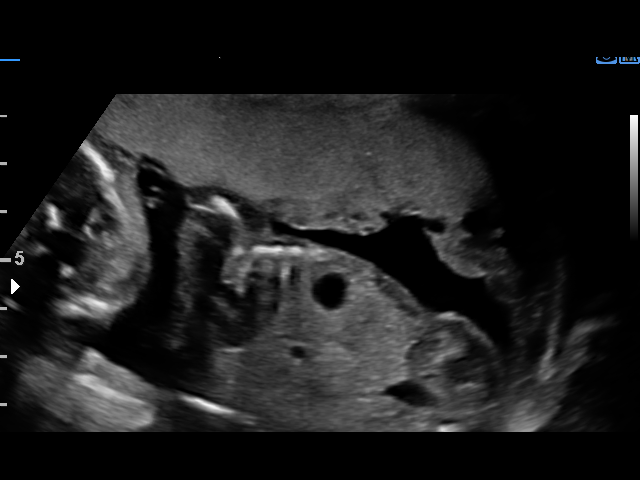
[im 43/58]
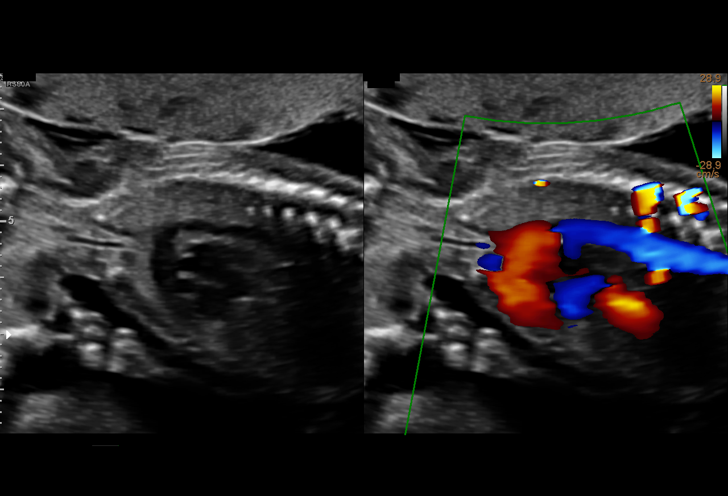
[im 47/58]
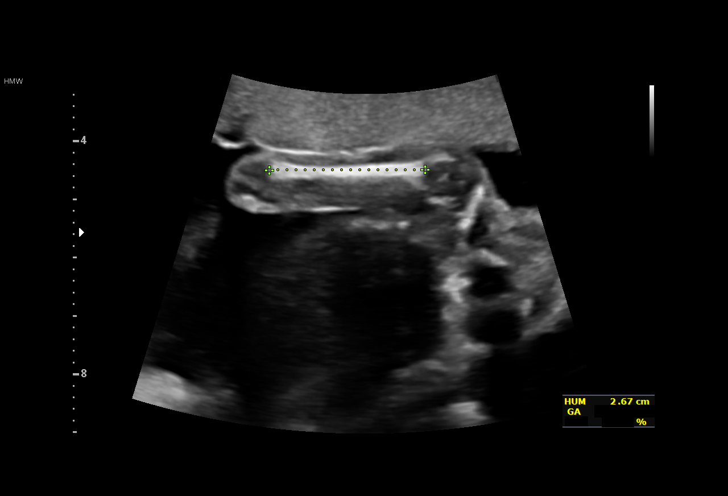
[im 51/58]
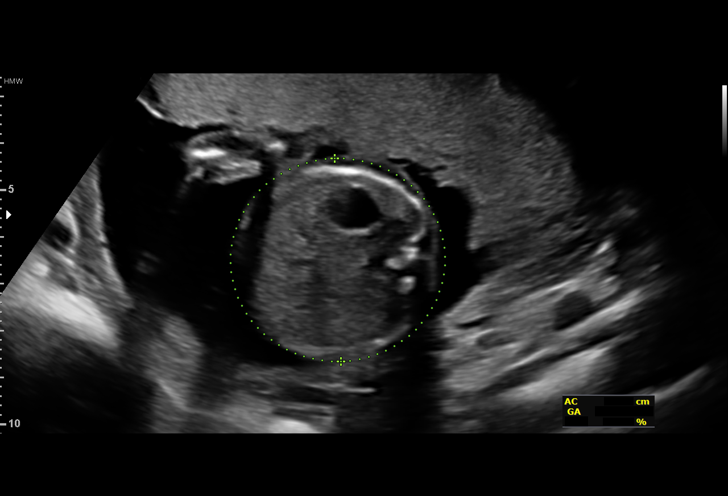
[im 55/58]
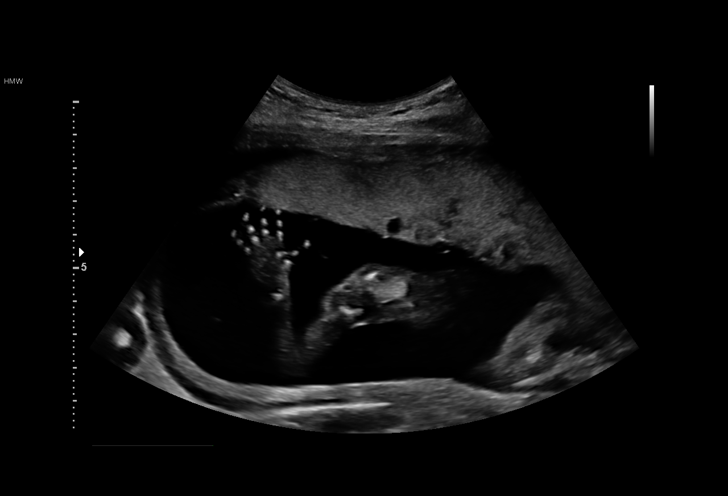

[13 of 28 positions shown; findings below may reference images not displayed]

1  US MFM OB COMP + 14 WK               76805.01     RUZIL THANH
 ----------------------------------------------------------------------

 ----------------------------------------------------------------------
Indications

  19 weeks gestation of pregnancy
  Encounter for antenatal screening for
  malformations
  Genetic carrier (silent Gab Tiger, increased
  risk SMA)
  Low risk NIPS, CI.C44, neg AFP
 ----------------------------------------------------------------------
Fetal Evaluation

 Num Of Fetuses:         1
 Fetal Heart Rate(bpm):  137
 Cardiac Activity:       Observed
 Presentation:           Breech
 Placenta:               Anterior
 P. Cord Insertion:      Visualized, central

 Amniotic Fluid
 AFI FV:      Within normal limits

                             Largest Pocket(cm)

Biometry

 BPD:      41.9  mm     G. Age:  18w 5d         20  %    CI:        74.23   %    70 - 86
                                                         FL/HC:      17.1   %    16.1 -
 HC:      154.4  mm     G. Age:  18w 3d          6  %    HC/AC:      1.14        1.09 -
 AC:      135.2  mm     G. Age:  19w 0d         30  %    FL/BPD:     63.0   %
 FL:       26.4  mm     G. Age:  18w 0d          6  %    FL/AC:      19.5   %    20 - 24
 HUM:      25.9  mm     G. Age:  18w 1d         17  %
 CER:      18.2  mm     G. Age:  18w 1d         11  %
 NFT:       4.1  mm
 CM:        3.9  mm

 Est. FW:     244  gm      0 lb 9 oz      8  %
OB History

 Gravidity:    1         Term:   0        Prem:   0        SAB:   0
 TOP:          0       Ectopic:  0        Living: 0
Gestational Age

 LMP:           19w 3d        Date:  11/20/18                 EDD:   08/27/19
 U/S Today:     18w 4d                                        EDD:   09/02/19
 Best:          19w 3d     Det. By:  LMP  (11/20/18)          EDD:   08/27/19
Anatomy

 Cranium:               Appears normal         Aortic Arch:            Appears normal
 Cavum:                 Appears normal         Ductal Arch:            Appears normal
 Ventricles:            Appears normal         Diaphragm:              Appears normal
 Choroid Plexus:        Appears normal         Stomach:                Appears normal, left
                                                                       sided
 Cerebellum:            Appears normal         Abdomen:                Appears normal
 Posterior Fossa:       Appears normal         Abdominal Wall:         Appears nml (cord
                                                                       insert, abd wall)
 Nuchal Fold:           Appears normal         Cord Vessels:           Appears normal (3
                                                                       vessel cord)
 Face:                  Orbits nl; profile not Kidneys:                Appear normal
                        well visualized
 Lips:                  Appears normal         Bladder:                Appears normal
 Thoracic:              Appears normal         Spine:                  Appears normal
 Heart:                 Not well visualized    Upper Extremities:      Appears normal
 RVOT:                  Not well visualized    Lower Extremities:      Appears normal
 LVOT:                  Not well visualized

 Other:  Technically difficult due to fetal position. Heels and 5th digit visualized.
Cervix Uterus Adnexa

 Cervix
 Length:            3.2  cm.
 Normal appearance by transabdominal scan.

 Uterus
 No abnormality visualized.

 Left Ovary
 No adnexal mass visualized.

 Right Ovary
 No adnexal mass visualized.

 Cul De Sac
 No free fluid seen.

 Adnexa
 No abnormality visualized.
Impression

 Ms. Hunte, Agus1 P0, is here for fetal anatomy scan.
 On cell-free fetal DNA screening, the risks of fetal
 aneuploidies are not increased. MSAFP screening showed
 low risk for open-neural tube defects.

 We performed fetal anatomy scan. No makers of
 aneuploidies or fetal structural defects are seen. Fetal
 biometry is consistent with her previously-established dates.
 Amniotic fluid is normal and good fetal activity is seen.
 Patient understands the limitations of ultrasound in detecting
 fetal anomalies.
 Patient is a silent carrier for alpha thalassemia (aa/a-). She
 also has an increased carrier risk for Spinal Muscular
 Atrophy. I briefly discussed the carrier screening results and
 recommended genetic counseling. Patient agreed to meet
 with our genetic counselor.
Recommendations

 -An appointment was made for her to return in 4 weeks for
 completion of fetal anatomy.
 -Genetic counseling appointment in 1 to 2 weeks.
                 Kisser, [place]

## 2020-09-28 MED ORDER — METRONIDAZOLE 500 MG PO TABS: 500.0000 mg | ORAL_TABLET | Freq: Two times a day (BID) | ORAL | 0 refills | Status: AC

## 2020-09-28 MED ORDER — PHENAZOPYRIDINE HCL 200 MG PO TABS: 200.0000 mg | ORAL_TABLET | Freq: Three times a day (TID) | ORAL | 0 refills | Status: DC | PRN

## 2020-09-28 MED ORDER — NITROFURANTOIN MONOHYD MACRO 100 MG PO CAPS: 100.0000 mg | ORAL_CAPSULE | Freq: Two times a day (BID) | ORAL | 0 refills | Status: AC

## 2020-09-28 NOTE — ED Triage Notes (Addendum)
Pt reports having an odor to her vaginal discharge x approx 2 wks ago along with polyuria.  Denies any dysuria.  Also c/o ongoing generalized back pain.

## 2020-09-28 NOTE — Discharge Instructions (Addendum)
I suspect you have BV.  Finish the Flagyl, even if you feel better.  I am also treating you for urinary tract infection.  I am sending off your urine for culture to make sure that we have you on the right antibiotic.  Give [place] a working phone number so that we can contact you if needed. Refrain from sexual contact until your results have come back and your partner(s) are treated if necessary. Return to the ER if you get worse, have a fever >100.4, or for any concerns.   Below is a list of primary care practices who are taking new patients for you to follow-up with.  [facility] internal medicine clinic Ground Floor - [facility], [address] [place], [place], [place] [address] [phone]  [facility] at [facility] [address] [place], [place] [address] [phone]  [facility] [address] [place], [place] [address] [phone]  [facility] [phone] [address] [place] [address]  [facility]: [address] [place] [address]  [phone]  [facility]: [address] [place] [address]  [phone]  [place] [facility] : [address] [place] [address] [phone]  [facility]: [address] [place] [address] [phone]  [facility]: [address] [place] [address] [phone]  Dr. Oneal Grout [address] [facility] [facility] [place] [address]  [phone]  Go to www.goodrx.com  or www.costplusdrugs.com to look up your medications. This will give you a list of where you can find your prescriptions at the most affordable prices. Or ask the pharmacist what the cash price is, or if they have any other discount programs available to help make your  medication more affordable. This can be less expensive than what you would pay with insurance.

## 2020-09-28 NOTE — ED Provider Notes (Signed)
HPI  SUBJECTIVE:  Molly Evans is a 20 y.o. female who presents with 1 to 2 weeks of urinary frequency, odorous milky white thick creamy vaginal discharge, occasional dysuria and urinary frequency.  Symptoms started after having menses.  No urinary urgency, cloudy, odorous urine, hematuria.  No vaginal bleeding, genital rash, vaginal itching, abdominal, back, pelvic pain.  She is in a relationship with a female, who is asymptomatic.  She does not have any other sexual partners.  She is not sure about him.  No aggravating or alleviating factors.  She has not tried anything for this.  No recent antibiotics.  She has a past medical history of gonorrhea, chlamydia, BV, yeast, UTI.  No history of HIV, HSV, syphilis, trichomonas, pyelonephritis, diabetes.  LMP 9/4.  She is unsure if she could be pregnant.  PMD: None.   Past Medical History:  Diagnosis Date   Skin condition resolved     Past Surgical History:  Procedure Laterality Date   NO PAST SURGERIES      Family History  Problem Relation Age of Onset   Hypertension Mother    Lupus Mother    Rheum arthritis Sister    Hypertension Sister    Heart disease Maternal Uncle    Rheum arthritis Maternal Grandmother    Cancer Neg Hx    Diabetes Neg Hx     Social History   Tobacco Use   Smoking status: Former    Types: Cigars   Smokeless tobacco: Never  Vaping Use   Vaping Use: Never used  Substance Use Topics   Alcohol use: Yes    Comment: occasionally   Drug use: Yes    Types: Marijuana    Comment: last smoked 7/28    No current facility-administered medications for this encounter.  Current Outpatient Medications:    metroNIDAZOLE (FLAGYL) 500 MG tablet, Take 1 tablet (500 mg total) by mouth 2 (two) times daily for 7 days., Disp: 14 tablet, Rfl: 0   nitrofurantoin, macrocrystal-monohydrate, (MACROBID) 100 MG capsule, Take 1 capsule (100 mg total) by mouth 2 (two) times daily for 5 days., Disp: 10 capsule, Rfl: 0    phenazopyridine (PYRIDIUM) 200 MG tablet, Take 1 tablet (200 mg total) by mouth 3 (three) times daily as needed for pain., Disp: 6 tablet, Rfl: 0  No Known Allergies   ROS  As noted in HPI.   Physical Exam  BP 116/75   Pulse 70   Temp 98.9 F (37.2 C) (Oral)   Resp 16   LMP 09/21/2020 (Exact Date)   SpO2 99%   Breastfeeding No   Constitutional: Well developed, well nourished, no acute distress Eyes:  EOMI, conjunctiva normal bilaterally HENT: Normocephalic, atraumatic,mucus membranes moist Respiratory: Normal inspiratory effort Cardiovascular: Normal rate GI: nondistended soft, nontender. No suprapubic, flank tenderness  back: No CVA tenderness GU: Deferred skin: No rash, skin intact Musculoskeletal: no deformities Neurologic: Alert & oriented x 3, no focal neuro deficits Psychiatric: Speech and behavior appropriate   ED Course   Medications - No data to display  Orders Placed This Encounter  Procedures   Urine Culture    Standing Status:   Standing    Number of Occurrences:   1    Order Specific Question:   Indication    Answer:   Urgency/frequency   POC Urinalysis dipstick    Standing Status:   Standing    Number of Occurrences:   1   POC urine pregnancy    Standing Status:  Standing    Number of Occurrences:   1    Results for orders placed or performed during the hospital encounter of 09/28/20 (from the past 24 hour(s))  POC Urinalysis dipstick     Status: Abnormal   Collection Time: 09/28/20  2:28 PM  Result Value Ref Range   Glucose, UA NEGATIVE NEGATIVE mg/dL   Bilirubin Urine NEGATIVE NEGATIVE   Ketones, ur NEGATIVE NEGATIVE mg/dL   Specific Gravity, Urine >=1.030 1.005 - 1.030   Hgb urine dipstick LARGE (A) NEGATIVE   pH 5.5 5.0 - 8.0   Protein, ur 30 (A) NEGATIVE mg/dL   Urobilinogen, UA 0.2 0.0 - 1.0 mg/dL   Nitrite POSITIVE (A) NEGATIVE   Leukocytes,Ua TRACE (A) NEGATIVE  POC urine pregnancy     Status: None   Collection Time: 09/28/20   2:41 PM  Result Value Ref Range   Preg Test, Ur NEGATIVE NEGATIVE   No results found.  ED Clinical Impression  1. Vaginal discharge   2. Urinary tract infection with hematuria, site unspecified    ED Assessment/Plan  Patient had Klebsiella pneumonia UTI earlier this year that was resistant to ampicillin, otherwise pansensitive.  She has also had BV and yeast vaginitis before.  Suspect BV.  Will send home with Flagyl.  However she does have hematuria, nitrites and esterase in her UA, so will send home with Macrobid for presumed UTI.  Sending urine off for culture to confirm diagnosis and antibiotic choice.  Also Pyridium for symptomatic relief.  Advised pt to refrain from sexual contact until labs are back, symptoms resolve, and partner(s) are treated if necessary. Pt provided working phone number. Follow-up with PMD of choice as needed.  Will provide primary care list for ongoing care.  Discussed labs, MDM, plan and followup with patient. Pt agrees with plan.   Meds ordered this encounter  Medications   metroNIDAZOLE (FLAGYL) 500 MG tablet    Sig: Take 1 tablet (500 mg total) by mouth 2 (two) times daily for 7 days.    Dispense:  14 tablet    Refill:  0   phenazopyridine (PYRIDIUM) 200 MG tablet    Sig: Take 1 tablet (200 mg total) by mouth 3 (three) times daily as needed for pain.    Dispense:  6 tablet    Refill:  0   nitrofurantoin, macrocrystal-monohydrate, (MACROBID) 100 MG capsule    Sig: Take 1 capsule (100 mg total) by mouth 2 (two) times daily for 5 days.    Dispense:  10 capsule    Refill:  0    *This clinic note was created using Scientist, clinical (histocompatibility and immunogenetics). Therefore, there may be occasional mistakes despite careful proofreading.  ?     Domenick Gong, MD 09/29/20 1015

## 2020-09-29 IMAGING — US US BREAST*R* LIMITED INC AXILLA
1 series · 5 of 5 positions shown · non-contrast
Comparison: None

CLINICAL DATA: Bilateral nipple tenderness.

EXAM:
ULTRASOUND OF THE BILATERAL BREAST

[Series 1: us breast*right* limited inc axilla · 0.08mm/px · 5 of 5 slices shown]
[im 1/5]
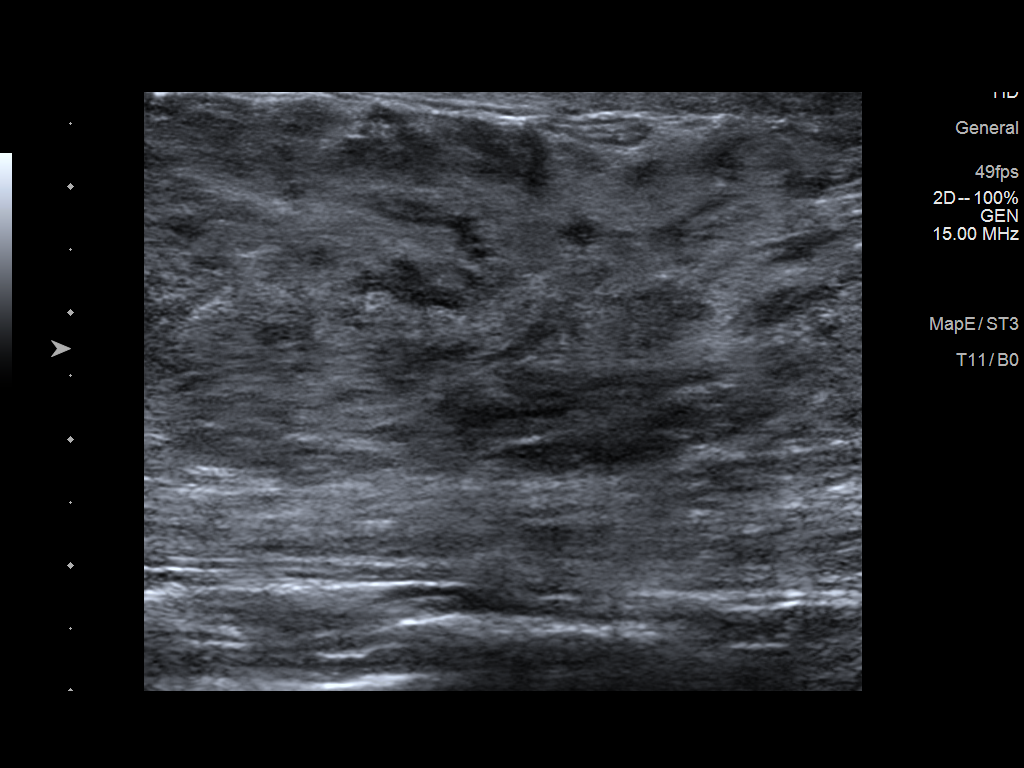
[im 2/5]
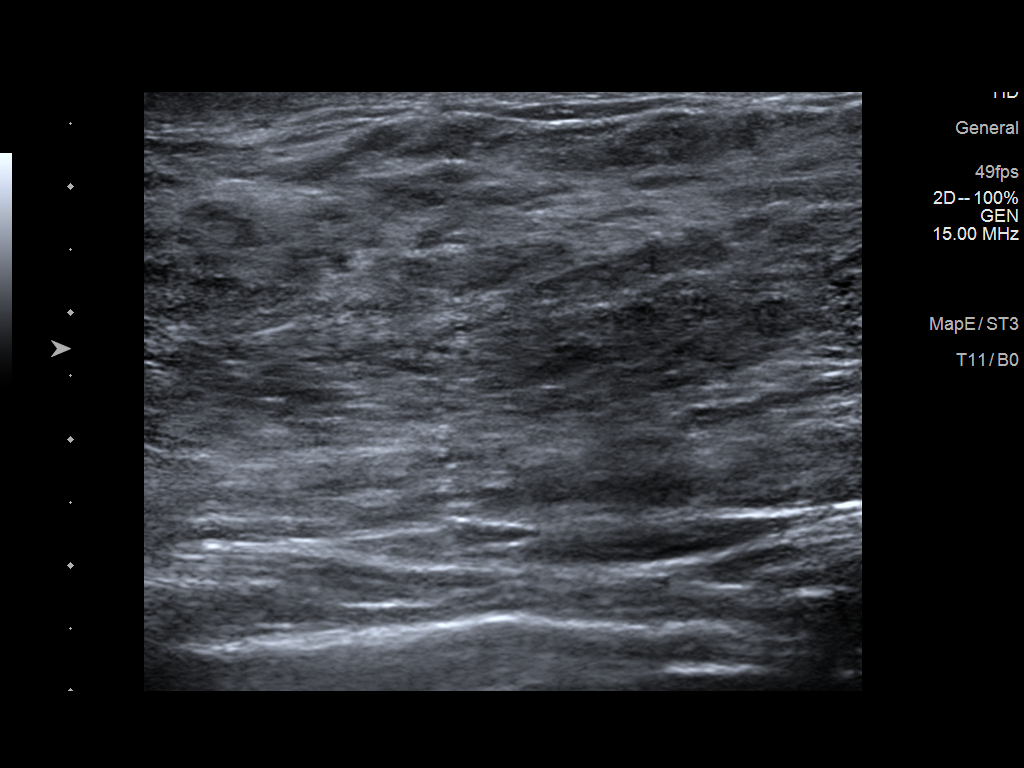
[im 3/5]
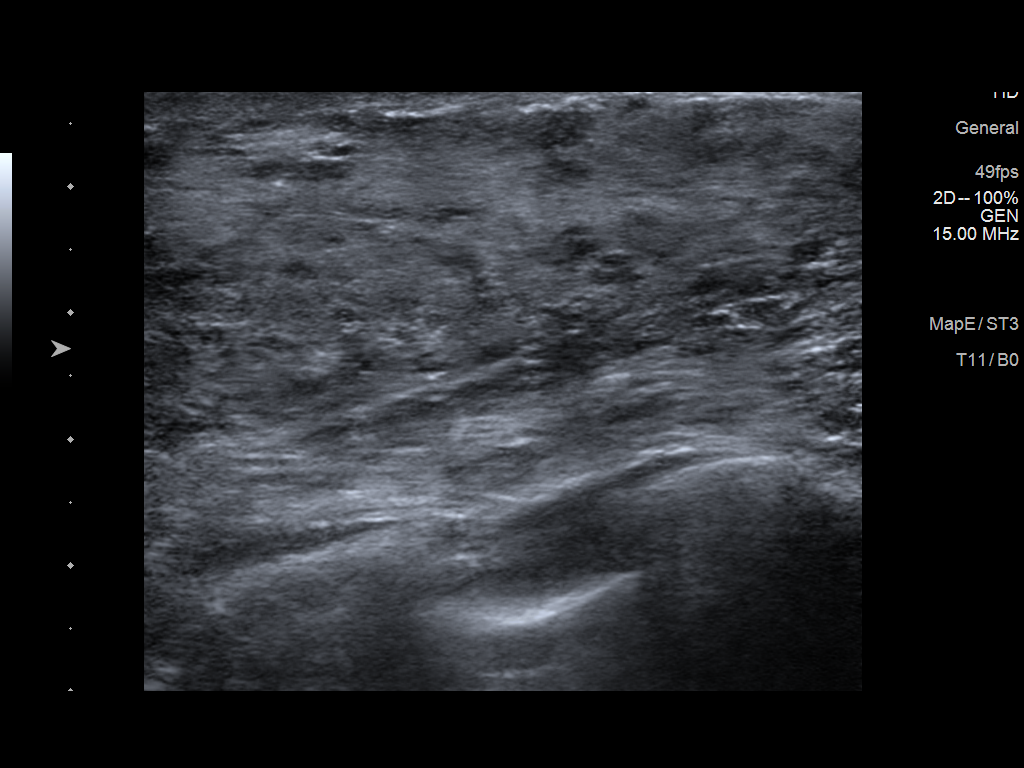
[im 4/5]
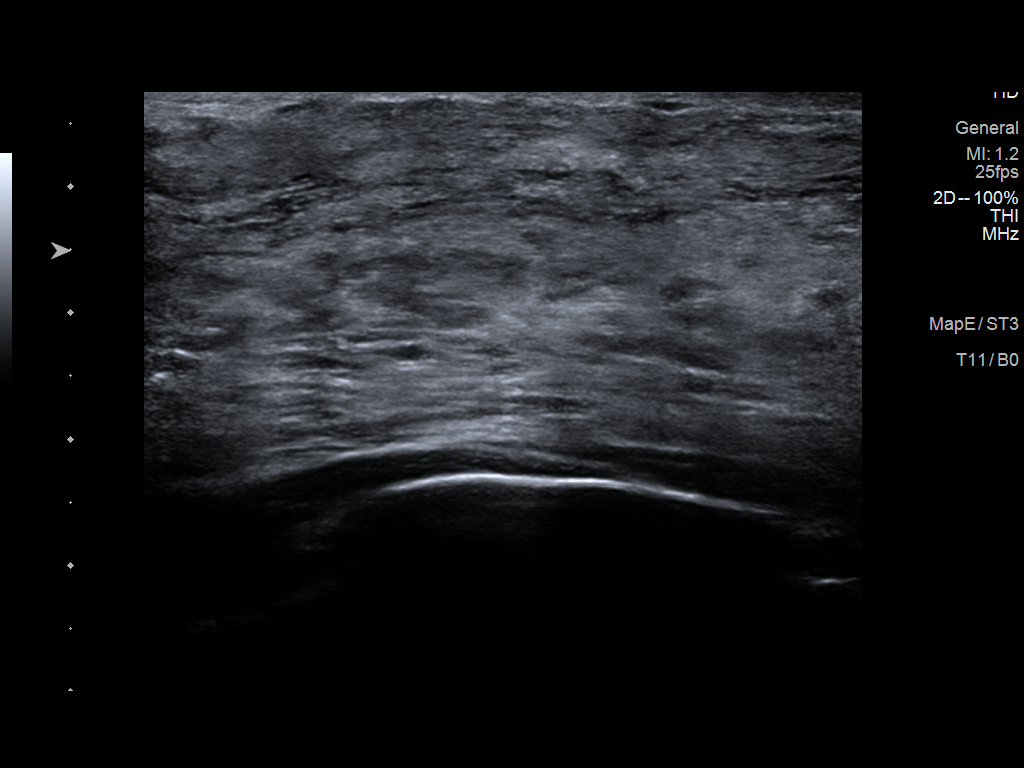
[im 5/5]
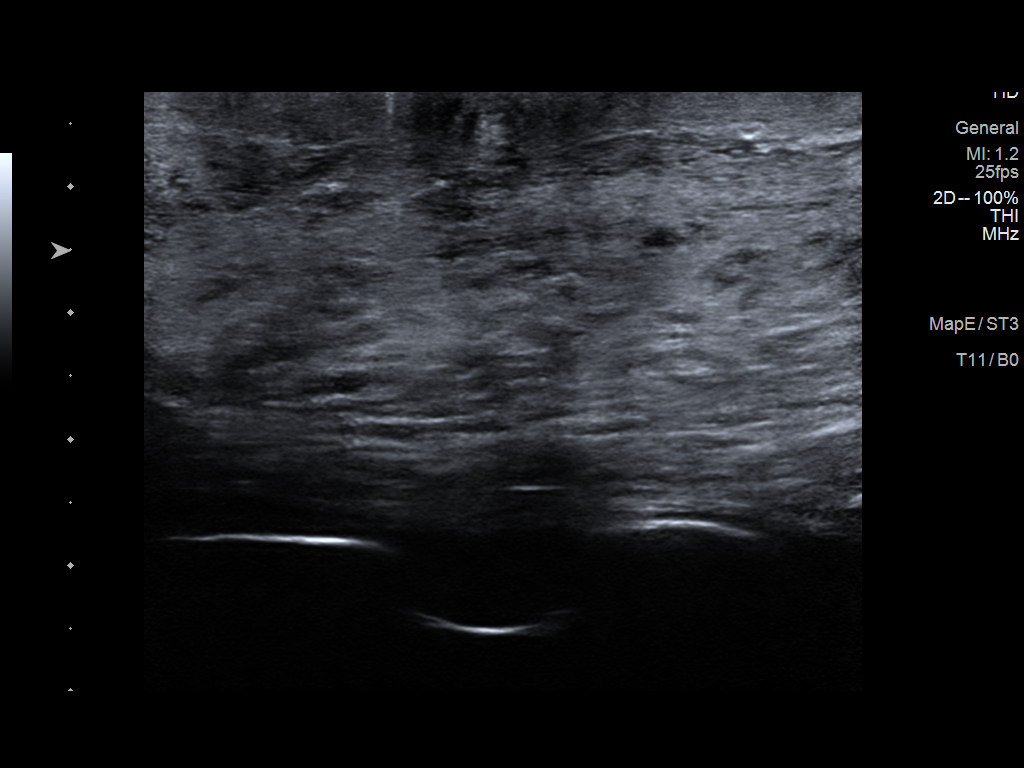

[5 of 5 positions shown; findings below may reference images not displayed]

FINDINGS: On physical exam, no overt signs of mastitis are identified.

Targeted ultrasound is performed, showing no sonographic
abnormalities in the right breast. No correlate for the patient's
nipple tenderness on the left. The patient does have a mass in the
left breast at 11 o'clock, 13 cm from the nipple measuring 2 by
x 0.8 cm. She states this mass is been stable for a long time, at
least a year.
IMPRESSION: Probably benign left breast mass. No cause for the patient's nipple
tenderness identified.

RECOMMENDATION:
Treatment of the patient's nipple pain should be based on clinical
and physical exam given lack of imaging findings. The probably
benign left breast mass should be followed in 6 months with an
ultrasound to ensure stability.

I have discussed the findings and recommendations with the patient.
If applicable, a reminder letter will be sent to the patient
regarding the next appointment.

BI-RADS CATEGORY  3: Probably benign.

## 2020-09-30 LAB — CERVICOVAGINAL ANCILLARY ONLY
Bacterial Vaginitis (gardnerella): POSITIVE — AB
Candida Glabrata: NEGATIVE
Candida Vaginitis: NEGATIVE
Chlamydia: NEGATIVE
Comment: NEGATIVE
Comment: NEGATIVE
Comment: NEGATIVE
Comment: NEGATIVE
Comment: NEGATIVE
Comment: NORMAL
Neisseria Gonorrhea: NEGATIVE
Trichomonas: NEGATIVE

## 2020-10-01 LAB — URINE CULTURE: Culture: >=100000 — AB

## 2020-10-10 ENCOUNTER — Ambulatory Visit (HOSPITAL_COMMUNITY): Payer: Medicaid Other

## 2020-10-17 ENCOUNTER — Ambulatory Visit (HOSPITAL_COMMUNITY): Payer: Self-pay

## 2020-10-18 ENCOUNTER — Ambulatory Visit (HOSPITAL_COMMUNITY): Payer: Self-pay

## 2020-11-15 ENCOUNTER — Encounter (HOSPITAL_COMMUNITY): Payer: Self-pay | Admitting: Emergency Medicine

## 2020-11-15 ENCOUNTER — Ambulatory Visit (HOSPITAL_COMMUNITY)
Admission: EM | Admit: 2020-11-15 | Discharge: 2020-11-15 | Disposition: A | Discharge status: Home / Self Care | Payer: Medicaid Other | Attending: Emergency Medicine | Admitting: Emergency Medicine

## 2020-11-15 ENCOUNTER — Other Ambulatory Visit: Payer: Self-pay

## 2020-11-15 ENCOUNTER — Ambulatory Visit (HOSPITAL_COMMUNITY): Payer: Self-pay

## 2020-11-15 DIAGNOSIS — N10 Acute tubulo-interstitial nephritis: Secondary | ICD-10-CM | POA: Insufficient documentation

## 2020-11-15 LAB — POCT URINALYSIS DIPSTICK, ED / UC
Bilirubin Urine: NEGATIVE
Glucose, UA: NEGATIVE mg/dL
Leukocytes,Ua: NEGATIVE
Nitrite: POSITIVE — AB
Protein, ur: 100 mg/dL — AB
Specific Gravity, Urine: >=1.03 (ref 1.005–1.030)
Urobilinogen, UA: 0.2 mg/dL (ref 0.0–1.0)
pH: 5.5 (ref 5.0–8.0)

## 2020-11-15 LAB — POC URINE PREG, ED: Preg Test, Ur: NEGATIVE

## 2020-11-15 MED ORDER — KETOROLAC TROMETHAMINE 60 MG/2ML IM SOLN
INTRAMUSCULAR | Status: AC
  Filled 2020-11-15: qty 2

## 2020-11-15 MED ORDER — CIPROFLOXACIN HCL 500 MG PO TABS: 500.0000 mg | ORAL_TABLET | Freq: Two times a day (BID) | ORAL | 0 refills | Status: AC

## 2020-11-15 MED ORDER — KETOROLAC TROMETHAMINE 60 MG/2ML IM SOLN
60.0000 mg | Freq: Once | INTRAMUSCULAR | Status: AC
  Administered 2020-11-15: 60 mg via INTRAMUSCULAR

## 2020-11-15 NOTE — Discharge Instructions (Addendum)
Urinalysis performed in the clinic today reveals a significant urinary tract infection that is most likely set up shop in your right kidney.  To treat this, please begin ciprofloxacin this evening and continue taking twice daily for the next 7 days.  You will be notified of the results of your STD screening once they are available, if treatment is needed we will provide that for you.  Your urine pregnancy test today is negative.

## 2020-11-15 NOTE — ED Triage Notes (Signed)
Pt c/o "kidney pain since Monday and popping lots of medications for pain and not helping". Adds vomiting up yellow fluids. Also needing STD screening for vaginal discharge that is yellowish and has odor.  Pt adds that she "cant stay up" every time lays down falls asleep.  Pt demanding something for pain during triage.  Adds that her legs feel weak since yesterday.
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
## 2020-11-17 ENCOUNTER — Encounter (HOSPITAL_COMMUNITY): Payer: Self-pay

## 2020-11-17 ENCOUNTER — Other Ambulatory Visit: Payer: Self-pay

## 2020-11-17 ENCOUNTER — Ambulatory Visit (HOSPITAL_COMMUNITY)
Admission: RE | Admit: 2020-11-17 | Discharge: 2020-11-17 | Disposition: A | Discharge status: Home / Self Care | Payer: Medicaid Other | Source: Ambulatory Visit | Attending: Student | Admitting: Student

## 2020-11-17 VITALS — BP 113/76 | HR 78 | Temp 98.3°F | Resp 16

## 2020-11-17 DIAGNOSIS — N12 Tubulo-interstitial nephritis, not specified as acute or chronic: Secondary | ICD-10-CM

## 2020-11-17 LAB — CERVICOVAGINAL ANCILLARY ONLY
Bacterial Vaginitis (gardnerella): POSITIVE — AB
Candida Glabrata: NEGATIVE
Candida Vaginitis: NEGATIVE
Chlamydia: NEGATIVE
Comment: NEGATIVE
Comment: NEGATIVE
Comment: NEGATIVE
Comment: NEGATIVE
Comment: NEGATIVE
Comment: NORMAL
Neisseria Gonorrhea: NEGATIVE
Trichomonas: NEGATIVE

## 2020-11-17 LAB — POCT URINALYSIS DIPSTICK, ED / UC
Bilirubin Urine: NEGATIVE
Glucose, UA: NEGATIVE mg/dL
Ketones, ur: 15 mg/dL — AB
Nitrite: NEGATIVE
Protein, ur: 30 mg/dL — AB
Specific Gravity, Urine: 1.015 (ref 1.005–1.030)
Urobilinogen, UA: 0.2 mg/dL (ref 0.0–1.0)
pH: 6 (ref 5.0–8.0)
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
## 2020-11-17 NOTE — ED Triage Notes (Signed)
Pt presents with follow-up from visit on 10/29. States unable to work due to pain.

## 2020-11-17 NOTE — Discharge Instructions (Addendum)
-  Considering the extreme pain that you are in and the fact that this is not getting better with antibiotics, it is very important that you head to the emergency department.  You could have a kidney stone that is blocking the tube that your kidney drains from, this is a medical emergency.  You need imaging and possibly IV antibiotics that we cannot do in urgent care.

## 2020-11-18 ENCOUNTER — Telehealth (HOSPITAL_COMMUNITY): Payer: Self-pay | Admitting: Emergency Medicine

## 2020-11-18 MED ORDER — METRONIDAZOLE 500 MG PO TABS: 500.0000 mg | ORAL_TABLET | Freq: Two times a day (BID) | ORAL | 0 refills | Status: DC

## 2021-01-05 ENCOUNTER — Other Ambulatory Visit (HOSPITAL_COMMUNITY)
Admission: RE | Admit: 2021-01-05 | Discharge: 2021-01-05 | Disposition: A | Discharge status: Home / Self Care | Payer: Medicaid Other | Source: Ambulatory Visit | Attending: Nurse Practitioner | Admitting: Nurse Practitioner

## 2021-01-05 ENCOUNTER — Other Ambulatory Visit: Payer: Self-pay

## 2021-01-05 ENCOUNTER — Encounter: Payer: Self-pay | Admitting: Nurse Practitioner

## 2021-01-05 ENCOUNTER — Encounter: Payer: Self-pay | Admitting: Obstetrics and Gynecology

## 2021-01-05 ENCOUNTER — Ambulatory Visit (INDEPENDENT_AMBULATORY_CARE_PROVIDER_SITE_OTHER): Payer: Medicaid Other | Admitting: Nurse Practitioner

## 2021-01-05 VITALS — BP 120/67 | HR 90 | Ht 62.0 in | Wt 115.0 lb

## 2021-01-05 DIAGNOSIS — Z87448 Personal history of other diseases of urinary system: Secondary | ICD-10-CM | POA: Diagnosis not present

## 2021-01-05 DIAGNOSIS — Z01419 Encounter for gynecological examination (general) (routine) without abnormal findings: Secondary | ICD-10-CM

## 2021-01-05 LAB — POCT URINALYSIS DIPSTICK
Bilirubin, UA: NEGATIVE
Blood, UA: NEGATIVE
Glucose, UA: NEGATIVE
Ketones, UA: NEGATIVE
Leukocytes, UA: NEGATIVE
Nitrite, UA: NEGATIVE
Protein, UA: NEGATIVE
Spec Grav, UA: 1.015 (ref 1.010–1.025)
Urobilinogen, UA: 0.2 E.U./dL
pH, UA: 7 (ref 5.0–8.0)

## 2021-01-05 LAB — POCT URINE PREGNANCY: Preg Test, Ur: NEGATIVE

## 2021-01-05 NOTE — Progress Notes (Addendum)
GYN presents for AEX/PAP/STD screening.  Patient declined FLU vaccine.  Patient requested UPT which is NEGATIVE today.  She uses a Condom sometimes for [facility].

## 2021-01-06 ENCOUNTER — Encounter: Payer: Self-pay | Admitting: Nurse Practitioner

## 2021-01-06 LAB — HIV ANTIBODY (ROUTINE TESTING W REFLEX): HIV Screen 4th Generation wRfx: NONREACTIVE

## 2021-01-06 LAB — RPR: RPR Ser Ql: NONREACTIVE

## 2021-01-06 LAB — CERVICOVAGINAL ANCILLARY ONLY
Chlamydia: NEGATIVE
Comment: NEGATIVE
Comment: NORMAL
Neisseria Gonorrhea: NEGATIVE

## 2021-01-06 LAB — HEPATITIS C ANTIBODY: Hep C Virus Ab: <0.1 s/co ratio (ref 0.0–0.9)

## 2021-01-06 NOTE — Progress Notes (Signed)
GYNECOLOGY ANNUAL PREVENTATIVE CARE ENCOUNTER NOTE  Subjective:   Molly Evans is a 20 y.o. G32P1001 female here for a routine annual gynecologic exam.  Current complaints: Needs pap smear.   Denies abnormal vaginal bleeding, discharge, pelvic pain, problems with intercourse or other gynecologic concerns.    Gynecologic History Patient's last menstrual period was 12/14/2020 (exact date). Contraception: condoms Last Pap: NA.   Obstetric History OB History  Gravida Para Term Preterm AB Living  1 1 1     1   SAB IAB Ectopic Multiple Live Births        0 1    # Outcome Date GA Lbr Len/2nd Weight Sex Delivery Anes PTL Lv  1 Term 08/30/19 [redacted]w[redacted]d 13:24 / 01:47 7 lb 6.9 oz (3.371 kg) F Vag-Spont EPI  LIV    Past Medical History:  Diagnosis Date   Skin condition resolved     Past Surgical History:  Procedure Laterality Date   NO PAST SURGERIES      Current Outpatient Medications on File Prior to Visit  Medication Sig Dispense Refill   metroNIDAZOLE (FLAGYL) 500 MG tablet Take 1 tablet (500 mg total) by mouth 2 (two) times daily. 14 tablet 0   phenazopyridine (PYRIDIUM) 200 MG tablet Take 1 tablet (200 mg total) by mouth 3 (three) times daily as needed for pain. 6 tablet 0   No current facility-administered medications on file prior to visit.    No Known Allergies  Social History   Socioeconomic History   Marital status: Single    Spouse name: Not on file   Number of children: 1   Years of education: Not on file   Highest education level: Not on file  Occupational History   Not on file  Tobacco Use   Smoking status: Former    Types: Cigars   Smokeless tobacco: Never  Vaping Use   Vaping Use: Every day   Substances: Flavoring  Substance and Sexual Activity   Alcohol use: Yes    Comment: occasionally   Drug use: Yes    Types: Marijuana    Comment: last smoked 7/28   Sexual activity: Yes    Partners: Male    Birth control/protection: None  Other Topics  Concern   Not on file  Social History Narrative   8th grade, desiring to go out for track 2014, does hair styling, exercise - running   Social Determinants of Health   Financial Resource Strain: Not on file  Food Insecurity: Not on file  Transportation Needs: Not on file  Physical Activity: Not on file  Stress: Not on file  Social Connections: Not on file  Intimate Partner Violence: Not on file    Family History  Problem Relation Age of Onset   Hypertension Mother    Lupus Mother    Rheum arthritis Sister    Hypertension Sister    Heart disease Maternal Uncle    Rheum arthritis Maternal Grandmother    Cancer Neg Hx    Diabetes Neg Hx     The following portions of the patient's history were reviewed and updated as appropriate: allergies, current medications, past family history, past medical history, past social history, past surgical history and problem list.  Review of Systems Pertinent items noted in HPI and remainder of comprehensive ROS otherwise negative.   Objective:  BP 120/67    Pulse 90    Ht 5\' 2"  (1.575 m)    Wt 115 lb (52.2 kg)  LMP 12/14/2020 (Exact Date)    BMI 21.03 kg/m  CONSTITUTIONAL: Well-developed, well-nourished female in no acute distress.  HENT:  Normocephalic, atraumatic, External right and left ear normal.  EYES: Conjunctivae and EOM are normal. Pupils are equal, round.  No scleral icterus.  NECK: Normal range of motion, supple, no masses.  Normal thyroid.  SKIN: Skin is warm and dry. No rash noted. Not diaphoretic. No erythema. No pallor. NEUROLOGIC: Alert and oriented to person, place, and time. Normal reflexes, muscle tone coordination. No cranial nerve deficit noted. PSYCHIATRIC: Normal mood and affect. Normal behavior. Normal judgment and thought content. CARDIOVASCULAR: Normal heart rate noted, regular rhythm RESPIRATORY: Clear to auscultation bilaterally. Effort and breath sounds normal, no problems with respiration noted. BREASTS:  Symmetric in size. Skin changes, nipple drainage, or lymphadenopathy. 2 fibrocystic like areas on left breast noted.  Nontender, smooth and mobile ABDOMEN: Soft, no distention noted.  No tenderness, rebound or guarding.  PELVIC: Normal appearing external genitalia; normal appearing vaginal mucosa and cervix.  No abnormal discharge noted.  Pap smear obtained.  Normal uterine size, no other palpable masses, no uterine or adnexal tenderness. MUSCULOSKELETAL: Normal range of motion. No tenderness.  No cyanosis, clubbing, or edema.    Assessment and Plan:  1. Encounter for well woman exam with routine gynecological exam Left breast has 2 likely fibrocystic areas at 11 o'clock - nontender, unchanged per patient's report - and she declines any referral for further evaluation.    - Cytology - PAP( [facility]) - Cervicovaginal ancillary only( [facility]) - RPR - Hepatitis C Antibody - POCT urine pregnancy - HIV antibody (with reflex)  2. History of pyelonephritis Reviewed urinating after intercourse to avoid UTI  - POCT Urinalysis Dipstick  3.  Current Vaping Discussed stopping vaping strategies Reviewed 1-800-QUIT NOW but client was not interested in calling and states she can stop on her own.  Will follow up results of pap smear and manage accordingly. Routine preventative health maintenance measures emphasized. Please refer to After Visit Summary for other counseling recommendations.    Nolene Bernheim, RN, MSN, NP-BC Nurse Practitioner, [facility] for [facility]

## 2021-01-07 LAB — CYTOLOGY - PAP: Diagnosis: NEGATIVE

## 2021-01-29 ENCOUNTER — Ambulatory Visit (HOSPITAL_COMMUNITY): Payer: Self-pay

## 2021-04-29 ENCOUNTER — Ambulatory Visit (HOSPITAL_COMMUNITY): Payer: Self-pay

## 2021-05-06 ENCOUNTER — Ambulatory Visit (HOSPITAL_COMMUNITY): Payer: Self-pay

## 2021-05-10 ENCOUNTER — Ambulatory Visit (HOSPITAL_COMMUNITY)
Admission: RE | Admit: 2021-05-10 | Discharge: 2021-05-10 | Disposition: A | Discharge status: Home / Self Care | Payer: Medicaid Other | Source: Ambulatory Visit | Attending: Physician Assistant | Admitting: Physician Assistant

## 2021-05-10 ENCOUNTER — Encounter (HOSPITAL_COMMUNITY): Payer: Self-pay

## 2021-05-10 VITALS — BP 108/73 | HR 86 | Temp 98.5°F | Resp 18

## 2021-05-10 DIAGNOSIS — N898 Other specified noninflammatory disorders of vagina: Secondary | ICD-10-CM | POA: Insufficient documentation

## 2021-05-10 LAB — POCT URINALYSIS DIPSTICK, ED / UC
Bilirubin Urine: NEGATIVE
Glucose, UA: NEGATIVE mg/dL
Hgb urine dipstick: NEGATIVE
Ketones, ur: NEGATIVE mg/dL
Leukocytes,Ua: NEGATIVE
Nitrite: NEGATIVE
Protein, ur: NEGATIVE mg/dL
Specific Gravity, Urine: 1.025 (ref 1.005–1.030)
Urobilinogen, UA: 0.2 mg/dL (ref 0.0–1.0)
pH: 6.5 (ref 5.0–8.0)

## 2021-05-10 LAB — POC URINE PREG, ED: Preg Test, Ur: NEGATIVE

## 2021-05-10 MED ORDER — METRONIDAZOLE 500 MG PO TABS: 500.0000 mg | ORAL_TABLET | Freq: Two times a day (BID) | ORAL | 0 refills | Status: DC

## 2021-05-10 NOTE — ED Triage Notes (Signed)
Two week h/o vaginal odor and watery discharge. Pt notes abdominal pain at the time of her last sexual activity. No meds or suppositories taken. ?No vaginal itching or irritation. No known STD exposures. Notes some urinary frequency. ?
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
## 2021-05-10 NOTE — Discharge Instructions (Signed)
Your urine was normal in clinic today.  We will contact you if we need to arrange any additional treatment based on your swab results.  We are treating for bacterial vaginosis.  Take metronidazole twice daily for 1 week.  Do not drink any alcohol with taking this medication and for a few days after finishing the course as it will cause you to vomit.  Wear loosefitting cotton underwear and use hypoallergenic soaps and detergents.  If you develop any abdominal pain, pelvic pain, fever, nausea, vomiting you need to be seen immediately. ?

## 2021-05-11 LAB — CERVICOVAGINAL ANCILLARY ONLY
Bacterial Vaginitis (gardnerella): POSITIVE — AB
Candida Glabrata: NEGATIVE
Candida Vaginitis: NEGATIVE
Chlamydia: NEGATIVE
Comment: NEGATIVE
Comment: NEGATIVE
Comment: NEGATIVE
Comment: NEGATIVE
Comment: NEGATIVE
Comment: NORMAL
Neisseria Gonorrhea: NEGATIVE
Trichomonas: NEGATIVE

## 2021-08-21 ENCOUNTER — Ambulatory Visit (INDEPENDENT_AMBULATORY_CARE_PROVIDER_SITE_OTHER): Payer: Medicaid Other | Admitting: Primary Care

## 2021-08-21 ENCOUNTER — Encounter (INDEPENDENT_AMBULATORY_CARE_PROVIDER_SITE_OTHER): Payer: Self-pay | Admitting: Primary Care

## 2021-08-21 VITALS — BP 126/80 | HR 68 | Temp 98.3°F | Resp 16 | Wt 120.6 lb

## 2021-08-21 DIAGNOSIS — N946 Dysmenorrhea, unspecified: Secondary | ICD-10-CM

## 2021-08-21 DIAGNOSIS — L819 Disorder of pigmentation, unspecified: Secondary | ICD-10-CM | POA: Diagnosis not present

## 2021-08-21 NOTE — Progress Notes (Signed)
Patient is here for dark spot on here back . Patient is concern because it has been present for awhile.

## 2021-08-21 NOTE — Patient Instructions (Signed)
Human Papillomavirus Human papillomavirus (HPV) is a common virus that spreads easily from person to person through skin-to-skin or sexual contact. There are many types of HPV. It often does not cause symptoms. However, depending upon the type, it may sometimes cause warts in the genitals (genital or mucosal HPV), or on the hands or feet (cutaneous or nonmucosal HPV). It is possible to be infected for a long time and pass HPV to others without knowing it. In most cases of HPV, a person will recover from the virus without treatment within 2 years of being infected. However, in some cases, HPV infection can last longer and lead to serious health problems. Certain types of genital or mucosal HPV are considered high-risk and may cause cancers, including cancer of the lower part of the uterus (cervix), vagina, outer female genital area (vulva), penis, anus, and rectum as well as cancers of the oral cavity, such as the throat, tongue, and tonsils. What are the causes? HPV is caused by a virus that spreads from person to person through contact. This includes genital HPV, which spreads through oral, vaginal, or anal sex. What increases the risk? You may be more likely to develop this condition if you have or have had: Direct contact with a person with HPV. Unprotected oral, vaginal, or anal sex. Several sex partners. A sex partner who has other sex partners. Another sexually transmitted infection (STI). A weak disease-fighting system (immune system). Areas of damaged or non-intact skin. What are the signs or symptoms? Most people who have HPV do not have any symptoms. If symptoms are present, they may include: Wart-like lesions in the throat (from having oral sex). Warts on the infected skin. Genital warts that may itch, burn, bleed, or be painful during sex. How is this diagnosed? If you have wart-like lumps in the anal area or throat, warts along the soles of your feet or palms of your hand, or if  genital warts are present, your health care provider can usually diagnose HPV with a physical exam. Genital warts are easily seen. In females, tests may be used to diagnose genital HPV, including: A Pap test. A Pap test takes a sample of cells from the cervix to check for cancer and HPV infection. An HPV test. This is similar to a Pap test and involves taking a sample of cells from the cervix. It may be done at the same time as a Pap test. Using a scope to view the cervix (colposcopy). This may be done if a pelvic exam or Pap test is abnormal. A sample of tissue may be removed for testing (biopsy) during the colposcopy. Currently, there is no test to detect genital HPV in males. How is this treated? There is no treatment for the virus itself. However, there are treatments for the health problems and symptoms HPV can cause. Treatment for HPV may include: Medicines in a cream, lotion, liquid, or gel form. These medicines may be injected into the warts, or applied directly to them. Use of a probe to apply extreme cold (cryotherapy) to the warts. Application of an intense beam of light (laser treatment) on the warts. Use of a probe to apply extreme heat (electrocautery) to the warts. Surgery to remove the warts. Your health care provider will monitor you closely after you are treated. HPV can come back and you may need treatment again. Follow these instructions at home: Medicines Take over-the-counter and prescription medicines only as told by your health care provider. This includes creams for itching  or irritation. Do not treat genital or anal warts with medicines used for treating hand warts. General instructions Do not touch or scratch the warts. Do not have sex while you are being treated. Do not douche or use tampons during treatment (for women). Tell your sex partner about your infection. He or she may also need to be treated. If you become pregnant, tell your health care provider that you  have HPV. Your health care provider will monitor you closely during pregnancy to make sure you and your baby are safe. Keep all follow-up visits. This is important. How is this prevented? Talk with your health care provider about getting an HPV vaccine, which can prevent some HPV infections and related cancers. It will not work if you already have HPV and is not recommended for pregnant women. You may need 2-3 doses of the vaccine, depending on your age. After treatment, use condoms during sex to prevent future infections. Have only one sex partner. Have a sex partner who does not have other sex partners. Get regular Pap tests as directed by your health care provider. Contact a health care provider if: The treated skin becomes red, swollen, or painful. You have a fever. You feel generally ill. You feel lumps or pimples in and around your genital or anal area. You develop bleeding of the vagina or the treatment area. You have painful sex. Summary Human papillomavirus (HPV) is a common virus that spreads easily from person to person and ishighly contagious. Many people carrying HPV do not have any symptoms. Many forms of HPV can be prevented with vaccination. There is no treatment for the virus itself. However, there are treatments for the health problems and symptoms HPV can cause. This information is not intended to replace advice given to you by your health care provider. Make sure you discuss any questions you have with your health care provider. Document Revised: 08/20/2019 Document Reviewed: 08/21/2019 Elsevier Patient Education  2023 [employer].

## 2021-08-21 NOTE — Progress Notes (Signed)
  [facility]  Molly Evans, is a 21 y.o. female  KWI:[id number]  MEQ:[id number]  DOB - [birth year]  No chief complaint on file.      Subjective:   Ms.Molly Evans is a 21 y.o. female here today for concerns of discoloration on back and chin- started 2-3 years ago but small spots now 3 times the size as ininital discoloration and spots. She denies irritation - itchy or pain.   Patient has No headache, No chest pain, No abdominal pain - No Nausea, No new weakness tingling or numbness, No Cough - shortness of breath  No problems updated.  No Known Allergies  Past Medical History:  Diagnosis Date   Skin condition resolved     Current Outpatient Medications on File Prior to Visit  Medication Sig Dispense Refill   metroNIDAZOLE (FLAGYL) 500 MG tablet Take 1 tablet (500 mg total) by mouth 2 (two) times daily. 14 tablet 0   phenazopyridine (PYRIDIUM) 200 MG tablet Take 1 tablet (200 mg total) by mouth 3 (three) times daily as needed for pain. 6 tablet 0   No current facility-administered medications on file prior to visit.    Objective:   Vitals:   08/21/21 0848  BP: 126/80  Pulse: 68  Resp: 16  Temp: 98.3 F (36.8 C)  TempSrc: Oral  SpO2: 99%  Weight: 120 lb 9.6 oz (54.7 kg)    Exam General appearance : Awake, alert, not in any distress. Speech Clear. Not toxic looking HEENT: Atraumatic and Normocephalic, pupils equally reactive to light and accomodation Neck: Supple, no JVD. No cervical lymphadenopathy.  Chest: Good air entry bilaterally, no added sounds  CVS: S1 S2 regular, no murmurs.  Abdomen: Bowel sounds present, Non tender and not distended with no gaurding, rigidity or rebound. Extremities: B/L Lower Ext shows no edema, both legs are warm to touch Neurology: Awake alert, and oriented X 3,  Non focal Skin: No Rash  Data Review Lab Results  Component Value Date   HGBA1C 5.3 10/24/2017    Assessment & Plan  .Diagnoses and all  orders for this visit:  Discoloration of skin         -     Ambulatory referral to Dermatology       Patient have been counseled extensively about nutrition and exercise. Other issues discussed during this visit include: low cholesterol diet, weight control and daily exercise, foot care, annual eye examinations at Ophthalmology, importance of adherence with medications and regular follow-up. We also discussed long term complications of uncontrolled diabetes and hypertension.   Return for annual physical.  The patient was given clear instructions to go to ER or return to medical center if symptoms don't improve, worsen or new problems develop. The patient verbalized understanding. The patient was told to call to get lab results if they haven't heard anything in the next week.   This note has been created with Education officer, environmental. Any transcriptional errors are unintentional.   Grayce Sessions, NP 08/27/2021, 9:05 AM

## 2021-08-22 LAB — CMP14+EGFR
ALT: 11 IU/L (ref 0–32)
AST: 18 IU/L (ref 0–40)
Albumin/Globulin Ratio: 2.4 — ABNORMAL HIGH (ref 1.2–2.2)
Albumin: 4.6 g/dL (ref 4.0–5.0)
Alkaline Phosphatase: 49 IU/L (ref 44–121)
BUN/Creatinine Ratio: 15 (ref 9–23)
BUN: 12 mg/dL (ref 6–20)
Bilirubin Total: <0.2 mg/dL (ref 0.0–1.2)
CO2: 20 mmol/L (ref 20–29)
Calcium: 9.1 mg/dL (ref 8.7–10.2)
Chloride: 101 mmol/L (ref 96–106)
Creatinine, Ser: 0.82 mg/dL (ref 0.57–1.00)
Globulin, Total: 1.9 g/dL (ref 1.5–4.5)
Glucose: 87 mg/dL (ref 70–99)
Potassium: 4.3 mmol/L (ref 3.5–5.2)
Sodium: 140 mmol/L (ref 134–144)
Total Protein: 6.5 g/dL (ref 6.0–8.5)
eGFR: 104 mL/min/{1.73_m2} (ref >59)

## 2021-08-22 LAB — CBC WITH DIFFERENTIAL/PLATELET
Basophils Absolute: 0 10*3/uL (ref 0.0–0.2)
Basos: 1 %
EOS (ABSOLUTE): 0.1 10*3/uL (ref 0.0–0.4)
Eos: 2 %
Hematocrit: 40.6 % (ref 34.0–46.6)
Hemoglobin: 12.9 g/dL (ref 11.1–15.9)
Immature Grans (Abs): 0 10*3/uL (ref 0.0–0.1)
Immature Granulocytes: 0 %
Lymphocytes Absolute: 2.1 10*3/uL (ref 0.7–3.1)
Lymphs: 41 %
MCH: 26.3 pg — ABNORMAL LOW (ref 26.6–33.0)
MCHC: 31.8 g/dL (ref 31.5–35.7)
MCV: 83 fL (ref 79–97)
Monocytes Absolute: 0.4 10*3/uL (ref 0.1–0.9)
Monocytes: 9 %
Neutrophils Absolute: 2.4 10*3/uL (ref 1.4–7.0)
Neutrophils: 47 %
Platelets: 249 10*3/uL (ref 150–450)
RBC: 4.91 x10E6/uL (ref 3.77–5.28)
RDW: 13.3 % (ref 11.7–15.4)
WBC: 5.1 10*3/uL (ref 3.4–10.8)

## 2021-09-02 ENCOUNTER — Telehealth (INDEPENDENT_AMBULATORY_CARE_PROVIDER_SITE_OTHER): Payer: Self-pay | Admitting: Primary Care

## 2021-09-02 ENCOUNTER — Ambulatory Visit (INDEPENDENT_AMBULATORY_CARE_PROVIDER_SITE_OTHER): Payer: Self-pay

## 2021-09-02 NOTE — Telephone Encounter (Signed)
Patient aware.

## 2021-09-02 NOTE — Telephone Encounter (Signed)
Summary: on going issues   Pt has a body issue and doesn't want to say what / please advise     Called pt and LM on VM to call back to discuss issue.

## 2021-09-02 NOTE — Telephone Encounter (Signed)
Copied from CRM [id number]. Topic: Referral - Status >> Sep 02, 2021  2:16 PM Lyman Speller wrote: Reason for CRM: pt needs a new dermatology referral / she stated that [facility] required a $200 payment / please advise

## 2021-09-02 NOTE — Telephone Encounter (Signed)
  Chief Complaint: Medication for BV Symptoms:  vaginal odor - similar to past BV infections Frequency: 1-2 weeks Pertinent Negatives: Patient denies  Disposition: [] ED /[] Urgent Care (no appt availability in office) / [] Appointment(In office/virtual)/ []  [place] [facility]/ [] Home Care/ [] Refused Recommended Disposition /[] [facility] [facility]/ [x]  Follow-up with PCP Additional Notes: Pt states that she has had frequent BV infections and thinks that this is one again. Pt is wanting a rx to be called in for this. PT will come in for appt if provider is unable to prescribe medicine.  Please advise.    Summary: on going issues   Pt has a body issue and doesn't want to say what / please advise      Reason for Disposition  [1] Prescription refill request for ESSENTIAL medicine (i.e., likelihood of harm to patient if not taken) AND [2] triager unable to refill per department policy  Answer Assessment - Initial Assessment Questions 1. DRUG NAME: "What medicine do you need to have refilled?"     BV medication 2. REFILLS REMAINING: "How many refills are remaining?" (Note: The label on the medicine or pill bottle will show how many refills are remaining. If there are no refills remaining, then a renewal may be needed.)     no 3. EXPIRATION DATE: "What is the expiration date?" (Note: The label states when the prescription will expire, and thus can no longer be refilled.)      4. PRESCRIBING HCP: "Who prescribed it?" Reason: If prescribed by specialist, call should be referred to that group.      5. SYMPTOMS: "Do you have any symptoms?"     Vaginal smell 6. PREGNANCY: "Is there any chance that you are pregnant?" "When was your last menstrual period?"  Protocols used: Medication Refill and Renewal Call-A-AH

## 2021-09-02 NOTE — Telephone Encounter (Signed)
Advised patient that she would need to be evaluated. Scheduled for first available according to her availability. Offered mobile unit but she wanted to be seen by PCP.

## 2021-09-02 NOTE — Telephone Encounter (Signed)
Left voicemail asking patient to return call.  Spoke with referral coordinator, no where to refer patient due to her insurance.

## 2021-09-10 ENCOUNTER — Ambulatory Visit (INDEPENDENT_AMBULATORY_CARE_PROVIDER_SITE_OTHER): Payer: Medicaid Other | Admitting: Primary Care

## 2021-11-03 ENCOUNTER — Telehealth: Payer: Self-pay

## 2021-11-03 NOTE — Telephone Encounter (Signed)
Patient called and left message on triage vm.  Returned call. No answer. Left vm for patient to return call.

## 2021-11-27 ENCOUNTER — Other Ambulatory Visit (HOSPITAL_COMMUNITY)
Admission: RE | Admit: 2021-11-27 | Discharge: 2021-11-27 | Disposition: A | Discharge status: Home / Self Care | Payer: Medicaid Other | Source: Ambulatory Visit | Attending: Primary Care | Admitting: Primary Care

## 2021-11-27 ENCOUNTER — Ambulatory Visit (INDEPENDENT_AMBULATORY_CARE_PROVIDER_SITE_OTHER): Payer: Medicaid Other

## 2021-11-27 DIAGNOSIS — N898 Other specified noninflammatory disorders of vagina: Secondary | ICD-10-CM | POA: Diagnosis not present

## 2021-11-30 ENCOUNTER — Other Ambulatory Visit (INDEPENDENT_AMBULATORY_CARE_PROVIDER_SITE_OTHER): Payer: Self-pay | Admitting: Primary Care

## 2021-11-30 LAB — CERVICOVAGINAL ANCILLARY ONLY
Bacterial Vaginitis (gardnerella): POSITIVE — AB
Candida Glabrata: NEGATIVE
Candida Vaginitis: NEGATIVE
Chlamydia: NEGATIVE
Comment: NEGATIVE
Comment: NEGATIVE
Comment: NEGATIVE
Comment: NEGATIVE
Comment: NEGATIVE
Comment: NORMAL
Neisseria Gonorrhea: NEGATIVE
Trichomonas: NEGATIVE

## 2021-11-30 MED ORDER — METRONIDAZOLE 500 MG PO TABS: 500.0000 mg | ORAL_TABLET | Freq: Two times a day (BID) | ORAL | 0 refills | Status: DC

## 2021-12-11 ENCOUNTER — Encounter (HOSPITAL_BASED_OUTPATIENT_CLINIC_OR_DEPARTMENT_OTHER): Payer: Self-pay | Admitting: Emergency Medicine

## 2021-12-11 ENCOUNTER — Other Ambulatory Visit: Payer: Self-pay

## 2021-12-11 DIAGNOSIS — R000 Tachycardia, unspecified: Secondary | ICD-10-CM | POA: Diagnosis not present

## 2021-12-11 DIAGNOSIS — R519 Headache, unspecified: Secondary | ICD-10-CM | POA: Insufficient documentation

## 2021-12-11 DIAGNOSIS — T50995A Adverse effect of other drugs, medicaments and biological substances, initial encounter: Secondary | ICD-10-CM | POA: Diagnosis not present

## 2021-12-11 DIAGNOSIS — R002 Palpitations: Secondary | ICD-10-CM | POA: Insufficient documentation

## 2021-12-11 DIAGNOSIS — R200 Anesthesia of skin: Secondary | ICD-10-CM | POA: Diagnosis not present

## 2021-12-11 NOTE — ED Triage Notes (Signed)
Took "goody powder" for headache around 7 PM. Now feeling weird and shaky. Palpitations. Numbness in hands.  Has taken similar before but not this one

## 2021-12-12 ENCOUNTER — Emergency Department (HOSPITAL_BASED_OUTPATIENT_CLINIC_OR_DEPARTMENT_OTHER)
Admission: EM | Admit: 2021-12-12 | Discharge: 2021-12-12 | Disposition: A | Discharge status: Home / Self Care | Payer: Medicaid Other | Attending: Emergency Medicine | Admitting: Emergency Medicine

## 2021-12-12 DIAGNOSIS — T50905A Adverse effect of unspecified drugs, medicaments and biological substances, initial encounter: Secondary | ICD-10-CM

## 2021-12-12 NOTE — ED Provider Notes (Signed)
[facility] EMERGENCY DEPT Provider Note   CSN: [id number] Arrival date & time: 12/11/21  2053     History  Chief Complaint  Patient presents with   Shaking    Molly Evans is a 21 y.o. female.  The history is provided by the patient.  Illness Location:  At home Quality:  Had a headache and took a goody powder and then felt heart racing Severity:  Moderate Onset quality:  Sudden Duration:  7 hours Timing:  Constant Progression:  Resolved Chronicity:  New      Home Medications Prior to Admission medications   Medication Sig Start Date End Date Taking? Authorizing Provider  metroNIDAZOLE (FLAGYL) 500 MG tablet Take 1 tablet (500 mg total) by mouth 2 (two) times daily. 11/30/21   Grayce Sessions, NP  phenazopyridine (PYRIDIUM) 200 MG tablet Take 1 tablet (200 mg total) by mouth 3 (three) times daily as needed for pain. 09/28/20   Domenick Gong, MD      Allergies    Patient has no known allergies.    Review of Systems   Review of Systems  Physical Exam Updated Vital Signs BP 116/76   Pulse 73   Temp 98.6 F (37 C) (Oral)   Resp 16   LMP 11/29/2021 (Approximate)   SpO2 98%  Physical Exam Vitals and nursing note reviewed.  Constitutional:      General: She is not in acute distress.    Appearance: Normal appearance. She is well-developed.  HENT:     Head: Normocephalic and atraumatic.     Nose: Nose normal.  Eyes:     Pupils: Pupils are equal, round, and reactive to light.  Cardiovascular:     Rate and Rhythm: Normal rate and regular rhythm.     Pulses: Normal pulses.     Heart sounds: Normal heart sounds.  Pulmonary:     Effort: Pulmonary effort is normal. No respiratory distress.     Breath sounds: Normal breath sounds.  Abdominal:     General: Bowel sounds are normal. There is no distension.     Palpations: Abdomen is soft.     Tenderness: There is no abdominal tenderness. There is no guarding or rebound.   Genitourinary:    Vagina: No vaginal discharge.  Musculoskeletal:        General: Normal range of motion.     Cervical back: Neck supple.  Skin:    General: Skin is warm and dry.     Capillary Refill: Capillary refill takes less than 2 seconds.     Findings: No erythema or rash.  Neurological:     General: No focal deficit present.     Mental Status: She is alert and oriented to person, place, and time.     Cranial Nerves: No cranial nerve deficit.     Deep Tendon Reflexes: Reflexes normal.  Psychiatric:        Mood and Affect: Mood normal.        Behavior: Behavior normal.     ED Results / Procedures / Treatments   Labs (all labs ordered are listed, but only abnormal results are displayed) Labs Reviewed - No data to display  EKG  EKG Interpretation  Date/Time:  Friday December 11 2021 21:07:11 EST Ventricular Rate:  117 PR Interval:  128 QRS Duration: 72 QT Interval:  320 QTC Calculation: 446 R Axis:   87 Text Interpretation: Sinus tachycardia Right atrial enlargement Confirmed by Nicanor Alcon, Mabel Unrein ([id number]) on 12/12/2021 2:31:20 AM  Radiology No results found.  Procedures Procedures    Medications Ordered in ED Medications - No data to display  ED Course/ Medical Decision Making/ A&P                           Medical Decision Making Patient had a headache and took a goody powder and then had heart racing.  No CP no SOB.  No OCP.  No travel   Amount and/or Complexity of Data Reviewed External Data Reviewed: notes.    Details: Previous notes reviewed  ECG/medicine tests: ordered and independent interpretation performed. Decision-making details documented in ED Course.  Risk Risk Details: Patient's heart rate is 73 on exam.  Cn 2-12 are intact.  Patient is markedly improved and has PO challenged.  I believe this is due to caffeine in the powder.  I have advised against using goody powders and BC powders.  No signs of allergic reaction.  Stable for  discharge.  Strict return.      Final Clinical Impression(s) / ED Diagnoses Final diagnoses:  Adverse effect of drug, initial encounter   Return for intractable cough, coughing up blood, fevers > 100.4 unrelieved by medication, shortness of breath, intractable vomiting, chest pain, shortness of breath, weakness, numbness, changes in speech, facial asymmetry, abdominal pain, passing out, Inability to tolerate liquids or food, cough, altered mental status or any concerns. No signs of systemic illness or infection. The patient is nontoxic-appearing on exam and vital signs are within normal limits.  I have reviewed the triage vital signs and the nursing notes. Pertinent labs & imaging results that were available during my care of the patient were reviewed by me and considered in my medical decision making (see chart for details). After history, exam, and medical workup I feel the patient has been appropriately medically screened and is safe for discharge home. Pertinent diagnoses were discussed with the patient. Patient was given return precautions.  Rx / DC Orders ED Discharge Orders     None         Edda Orea, MD 12/12/21 [id number]

## 2022-01-01 ENCOUNTER — Ambulatory Visit (INDEPENDENT_AMBULATORY_CARE_PROVIDER_SITE_OTHER): Payer: Self-pay | Admitting: *Deleted

## 2022-01-01 NOTE — Telephone Encounter (Signed)
Summary: Pt has a rash at her panty line.   Pt stated she has a rash at her panty line. Pt requests that a nurse return her call to advise of what she can do to soothe or relieve the itching. Cb# [id number]          Chief Complaint: Rash Symptoms: Diffuse dry rash "At panty line" Mostly at top "Stomach creases" States rash is "Flat like red dry areas, goes entire way across stomach crease." Itchy, varies. Frequency: 1-2 months Pertinent Negatives: Patient denies Pain, fever, not blistering or moist Disposition: [] ED /[] Urgent Care (no appt availability in office) / [x] Appointment(In office/virtual)/ []  [place] [facility]/ [] Home Care/ [] Refused Recommended Disposition /[] [place] [facility]/ []  Follow-up with PCP Additional Notes: Pt states she saw dermatologist at some point for something similar, does not recall DX. State cannot afford appt with dermatologist presently.  Pt can only come in on Thursdays. First available secured and placed on wait list for earlier Thursday. CAre advise provide, pt verbalizes understanding.  Reason for Disposition  Localized rash present > 7 days  Answer Assessment - Initial Assessment Questions 1. APPEARANCE of RASH: "Describe the rash."      Red, patchy 2. LOCATION: "Where is the rash located?"      Panty line, top at stomach crease 3. NUMBER: "How many spots are there?"      Diffuse red 4. SIZE: "How big are the spots?" (Inches, centimeters or compare to size of a coin)      Top is worse, stomach crease 5. ONSET: "When did the rash start?"      1-2 months 6. ITCHING: "Does the rash itch?" If Yes, ask: "How bad is the itch?"  (Scale 0-10; or none, mild, moderate, severe)     Yes varies 7. PAIN: "Does the rash hurt?" If Yes, ask: "How bad is the pain?"  (Scale 0-10; or none, mild, moderate, severe)    - NONE (0): no pain    - MILD (1-3): doesn't interfere with normal activities     - MODERATE (4-7): interferes with normal activities or  awakens from sleep     - SEVERE (8-10): excruciating pain, unable to do any normal activities     No 8. OTHER SYMPTOMS: "Do you have any other symptoms?" (e.g., fever)     No  Protocols used: Rash or Redness - Localized-A-AH

## 2022-01-05 ENCOUNTER — Ambulatory Visit (INDEPENDENT_AMBULATORY_CARE_PROVIDER_SITE_OTHER): Payer: Medicaid Other | Admitting: Primary Care

## 2022-01-28 ENCOUNTER — Ambulatory Visit (INDEPENDENT_AMBULATORY_CARE_PROVIDER_SITE_OTHER): Payer: Medicaid Other | Admitting: Primary Care

## 2022-02-18 ENCOUNTER — Ambulatory Visit: Payer: Medicaid Other | Admitting: Obstetrics and Gynecology

## 2022-03-09 ENCOUNTER — Other Ambulatory Visit (HOSPITAL_COMMUNITY)
Admission: RE | Admit: 2022-03-09 | Discharge: 2022-03-09 | Disposition: A | Discharge status: Home / Self Care | Payer: Medicaid Other | Source: Ambulatory Visit | Attending: Obstetrics and Gynecology | Admitting: Obstetrics and Gynecology

## 2022-03-09 ENCOUNTER — Other Ambulatory Visit (INDEPENDENT_AMBULATORY_CARE_PROVIDER_SITE_OTHER): Payer: Self-pay | Admitting: Primary Care

## 2022-03-09 ENCOUNTER — Ambulatory Visit (INDEPENDENT_AMBULATORY_CARE_PROVIDER_SITE_OTHER): Payer: Medicaid Other

## 2022-03-09 VITALS — Ht 62.0 in | Wt 114.0 lb

## 2022-03-09 DIAGNOSIS — N898 Other specified noninflammatory disorders of vagina: Secondary | ICD-10-CM | POA: Diagnosis not present

## 2022-03-09 DIAGNOSIS — Z1331 Encounter for screening for depression: Secondary | ICD-10-CM | POA: Diagnosis not present

## 2022-03-09 DIAGNOSIS — Z113 Encounter for screening for infections with a predominantly sexual mode of transmission: Secondary | ICD-10-CM | POA: Diagnosis not present

## 2022-03-09 NOTE — Progress Notes (Signed)
SUBJECTIVE:  22 y.o. female complains of white, malodorous, and thin vaginal discharge for 2 week(s). Denies abnormal vaginal bleeding or significant pelvic pain or fever. No UTI symptoms. Denies history of known exposure to STD.  No LMP recorded.  OBJECTIVE:  She appears well, afebrile. Urine dipstick: not done.  ASSESSMENT:  Vaginal Discharge  Vaginal Odor   PLAN:  GC, chlamydia, trichomonas, BVAG, CVAG probe sent to lab. Treatment: To be determined once lab results are received ROV prn if symptoms persist or worsen.   Patient states that sx are intermittent. Also desires STD blood work.

## 2022-03-10 LAB — HEPATITIS C ANTIBODY: Hep C Virus Ab: NONREACTIVE

## 2022-03-10 LAB — HEPATITIS B SURFACE ANTIGEN: Hepatitis B Surface Ag: NEGATIVE

## 2022-03-10 LAB — HIV ANTIBODY (ROUTINE TESTING W REFLEX): HIV Screen 4th Generation wRfx: NONREACTIVE

## 2022-03-10 LAB — RPR: RPR Ser Ql: NONREACTIVE

## 2022-03-11 ENCOUNTER — Other Ambulatory Visit: Payer: Self-pay | Admitting: Obstetrics and Gynecology

## 2022-03-11 LAB — CERVICOVAGINAL ANCILLARY ONLY
Bacterial Vaginitis (gardnerella): POSITIVE — AB
Candida Glabrata: NEGATIVE
Candida Vaginitis: NEGATIVE
Chlamydia: NEGATIVE
Comment: NEGATIVE
Comment: NEGATIVE
Comment: NEGATIVE
Comment: NEGATIVE
Comment: NEGATIVE
Comment: NORMAL
Neisseria Gonorrhea: NEGATIVE
Trichomonas: NEGATIVE

## 2022-03-11 MED ORDER — METRONIDAZOLE 500 MG PO TABS: 500.0000 mg | ORAL_TABLET | Freq: Two times a day (BID) | ORAL | 0 refills | Status: AC

## 2022-06-16 ENCOUNTER — Other Ambulatory Visit (HOSPITAL_COMMUNITY)
Admission: RE | Admit: 2022-06-16 | Discharge: 2022-06-16 | Disposition: A | Discharge status: Home / Self Care | Payer: Medicaid Other | Source: Ambulatory Visit | Attending: Obstetrics & Gynecology | Admitting: Obstetrics & Gynecology

## 2022-06-16 ENCOUNTER — Ambulatory Visit (INDEPENDENT_AMBULATORY_CARE_PROVIDER_SITE_OTHER): Payer: Medicaid Other

## 2022-06-16 VITALS — BP 123/79 | HR 81 | Ht 62.0 in | Wt 116.0 lb

## 2022-06-16 DIAGNOSIS — N898 Other specified noninflammatory disorders of vagina: Secondary | ICD-10-CM | POA: Diagnosis not present

## 2022-06-16 DIAGNOSIS — R350 Frequency of micturition: Secondary | ICD-10-CM

## 2022-06-16 LAB — POCT URINE PREGNANCY: Preg Test, Ur: NEGATIVE

## 2022-06-16 NOTE — Progress Notes (Signed)
SUBJECTIVE:  22 y.o. female complains of white vaginal discharge and urinary frequency for days. Denies abnormal vaginal bleeding or significant pelvic pain or fever.  Denies history of known exposure to STD.  Patient's last menstrual period was 06/14/2022 (approximate).  OBJECTIVE:  She appears well, afebrile.   ASSESSMENT:  Vaginal Discharge  Urinary Frequency   PLAN:  GC, chlamydia, trichomonas, BVAG, CVAG probe and Urine Culture sent to lab. Treatment: To be determined once lab results are received ROV prn if symptoms persist or worsen.

## 2022-06-17 LAB — CERVICOVAGINAL ANCILLARY ONLY
Bacterial Vaginitis (gardnerella): POSITIVE — AB
Candida Glabrata: NEGATIVE
Candida Vaginitis: NEGATIVE
Chlamydia: NEGATIVE
Comment: NEGATIVE
Comment: NEGATIVE
Comment: NEGATIVE
Comment: NEGATIVE
Comment: NORMAL
Neisseria Gonorrhea: NEGATIVE

## 2022-06-18 LAB — URINE CULTURE

## 2022-07-01 ENCOUNTER — Ambulatory Visit: Payer: Medicaid Other | Admitting: Obstetrics and Gynecology

## 2022-07-07 ENCOUNTER — Other Ambulatory Visit: Payer: Self-pay

## 2022-07-07 ENCOUNTER — Ambulatory Visit (HOSPITAL_COMMUNITY): Payer: Self-pay

## 2022-07-07 ENCOUNTER — Ambulatory Visit (HOSPITAL_COMMUNITY)
Admission: RE | Admit: 2022-07-07 | Discharge: 2022-07-07 | Disposition: A | Discharge status: Home / Self Care | Payer: Medicaid Other | Source: Ambulatory Visit | Attending: Emergency Medicine | Admitting: Emergency Medicine

## 2022-07-07 ENCOUNTER — Encounter (HOSPITAL_COMMUNITY): Payer: Self-pay

## 2022-07-07 VITALS — BP 123/87 | HR 82 | Temp 98.3°F | Resp 18 | Ht 62.0 in | Wt 116.8 lb

## 2022-07-07 DIAGNOSIS — Z113 Encounter for screening for infections with a predominantly sexual mode of transmission: Secondary | ICD-10-CM | POA: Insufficient documentation

## 2022-07-07 DIAGNOSIS — N898 Other specified noninflammatory disorders of vagina: Secondary | ICD-10-CM | POA: Insufficient documentation

## 2022-07-07 DIAGNOSIS — R350 Frequency of micturition: Secondary | ICD-10-CM | POA: Insufficient documentation

## 2022-07-07 LAB — POCT URINALYSIS DIP (MANUAL ENTRY)
Blood, UA: NEGATIVE
Glucose, UA: NEGATIVE mg/dL
Leukocytes, UA: NEGATIVE
Nitrite, UA: NEGATIVE
Spec Grav, UA: >=1.03 — AB (ref 1.010–1.025)
Urobilinogen, UA: 0.2 E.U./dL
pH, UA: 6 (ref 5.0–8.0)

## 2022-07-07 LAB — POCT URINE PREGNANCY: Preg Test, Ur: NEGATIVE
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
## 2022-07-07 NOTE — Discharge Instructions (Addendum)
We will call you if anything on your swab returns positive. You can also see these results on MyChart. Please abstain from sexual intercourse until your results return.  Please follow with your ob/gyn

## 2022-07-07 NOTE — ED Triage Notes (Signed)
Pt having some vaginal discharge and urinary frequency for few days.

## 2022-07-08 ENCOUNTER — Telehealth (HOSPITAL_COMMUNITY): Payer: Self-pay | Admitting: Emergency Medicine

## 2022-07-08 LAB — URINE CULTURE: Culture: NO GROWTH

## 2022-07-08 LAB — CERVICOVAGINAL ANCILLARY ONLY
Bacterial Vaginitis (gardnerella): POSITIVE — AB
Candida Glabrata: NEGATIVE
Candida Vaginitis: NEGATIVE
Chlamydia: NEGATIVE
Comment: NEGATIVE
Comment: NEGATIVE
Comment: NEGATIVE
Comment: NEGATIVE
Comment: NEGATIVE
Comment: NORMAL
Neisseria Gonorrhea: NEGATIVE
Trichomonas: NEGATIVE

## 2022-07-08 MED ORDER — METRONIDAZOLE 500 MG PO TABS: 500.0000 mg | ORAL_TABLET | Freq: Two times a day (BID) | ORAL | 0 refills | Status: DC

## 2022-08-16 ENCOUNTER — Ambulatory Visit (HOSPITAL_COMMUNITY): Payer: Self-pay

## 2022-08-30 ENCOUNTER — Ambulatory Visit (HOSPITAL_COMMUNITY): Payer: Self-pay

## 2022-09-16 ENCOUNTER — Ambulatory Visit (HOSPITAL_COMMUNITY): Payer: Self-pay

## 2022-10-02 ENCOUNTER — Encounter (HOSPITAL_COMMUNITY): Payer: Self-pay

## 2022-10-02 ENCOUNTER — Ambulatory Visit (HOSPITAL_COMMUNITY)
Admission: RE | Admit: 2022-10-02 | Discharge: 2022-10-02 | Disposition: A | Discharge status: Home / Self Care | Payer: Medicaid Other | Source: Ambulatory Visit | Attending: Internal Medicine | Admitting: Internal Medicine

## 2022-10-02 VITALS — BP 115/74 | HR 75 | Temp 98.1°F | Resp 16 | Ht 63.0 in | Wt 112.0 lb

## 2022-10-02 DIAGNOSIS — R300 Dysuria: Secondary | ICD-10-CM | POA: Diagnosis not present

## 2022-10-02 DIAGNOSIS — Z3202 Encounter for pregnancy test, result negative: Secondary | ICD-10-CM | POA: Insufficient documentation

## 2022-10-02 DIAGNOSIS — Z9189 Other specified personal risk factors, not elsewhere classified: Secondary | ICD-10-CM | POA: Diagnosis not present

## 2022-10-02 LAB — POCT URINALYSIS DIP (MANUAL ENTRY)
Bilirubin, UA: NEGATIVE
Blood, UA: NEGATIVE
Glucose, UA: NEGATIVE mg/dL
Ketones, POC UA: NEGATIVE mg/dL
Leukocytes, UA: NEGATIVE
Nitrite, UA: NEGATIVE
Protein Ur, POC: NEGATIVE mg/dL
Spec Grav, UA: 1.025 (ref 1.010–1.025)
Urobilinogen, UA: 0.2 U/dL
pH, UA: 6 (ref 5.0–8.0)

## 2022-10-02 LAB — HIV ANTIBODY (ROUTINE TESTING W REFLEX): HIV Screen 4th Generation wRfx: NONREACTIVE

## 2022-10-02 LAB — POCT URINE PREGNANCY: Preg Test, Ur: NEGATIVE

## 2022-10-02 NOTE — Discharge Instructions (Signed)
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
## 2022-10-02 NOTE — ED Triage Notes (Signed)
"  It itch and stink - Entered by patient"  Patient having vaginal discharge that is cloudy and urine is cloudy as well. Having blood in the urine that started today. Onset of other symptoms 2 days ago. No known std exposure.

## 2022-10-03 LAB — RPR: RPR Ser Ql: NONREACTIVE

## 2022-10-04 LAB — CERVICOVAGINAL ANCILLARY ONLY
Bacterial Vaginitis (gardnerella): POSITIVE — AB
Candida Glabrata: NEGATIVE
Candida Vaginitis: NEGATIVE
Chlamydia: NEGATIVE
Comment: NEGATIVE
Comment: NEGATIVE
Comment: NEGATIVE
Comment: NEGATIVE
Comment: NEGATIVE
Comment: NORMAL
Neisseria Gonorrhea: NEGATIVE
Trichomonas: NEGATIVE

## 2022-10-05 ENCOUNTER — Telehealth: Payer: Self-pay | Admitting: Emergency Medicine

## 2022-10-05 MED ORDER — METRONIDAZOLE 500 MG PO TABS: 500.0000 mg | ORAL_TABLET | Freq: Two times a day (BID) | ORAL | 0 refills | Status: DC

## 2022-10-05 NOTE — Telephone Encounter (Signed)
Metronidazole for positive BV

## 2023-02-07 ENCOUNTER — Telehealth: Payer: Self-pay | Admitting: Obstetrics and Gynecology

## 2023-02-07 NOTE — Telephone Encounter (Signed)
Patient had left voicemail on nurse line requesting treatment for BV.  Called patient to advise she would need to make an appointment with the provider, since she has not seen a provider in greater than one year.    Left voicemail for patient to call and schedule.

## 2023-02-14 ENCOUNTER — Ambulatory Visit: Payer: Medicaid Other

## 2023-02-17 ENCOUNTER — Ambulatory Visit (INDEPENDENT_AMBULATORY_CARE_PROVIDER_SITE_OTHER): Payer: Medicaid Other | Admitting: General Practice

## 2023-02-17 ENCOUNTER — Other Ambulatory Visit (HOSPITAL_COMMUNITY)
Admission: RE | Admit: 2023-02-17 | Discharge: 2023-02-17 | Disposition: A | Discharge status: Home / Self Care | Payer: Medicaid Other | Source: Ambulatory Visit | Attending: Obstetrics and Gynecology | Admitting: Obstetrics and Gynecology

## 2023-02-17 VITALS — BP 118/81 | HR 108 | Ht 63.0 in | Wt 110.2 lb

## 2023-02-17 DIAGNOSIS — N898 Other specified noninflammatory disorders of vagina: Secondary | ICD-10-CM | POA: Diagnosis not present

## 2023-02-17 DIAGNOSIS — R350 Frequency of micturition: Secondary | ICD-10-CM

## 2023-02-17 LAB — POCT URINALYSIS DIPSTICK
Bilirubin, UA: NEGATIVE
Blood, UA: NEGATIVE
Glucose, UA: NEGATIVE
Ketones, UA: NEGATIVE
Leukocytes, UA: NEGATIVE
Nitrite, UA: NEGATIVE
Protein, UA: NEGATIVE
Spec Grav, UA: 1.015 (ref 1.010–1.025)
Urobilinogen, UA: 0.2 U/dL
pH, UA: 6.5 (ref 5.0–8.0)

## 2023-02-17 NOTE — Progress Notes (Signed)
SUBJECTIVE:  23 y.o. female complains of malodorous vaginal discharge for 3 day(s). Denies abnormal vaginal bleeding or significant pelvic pain or fever. No UTI symptoms. Denies history of known exposure to STD.   No LMP recorded.  OBJECTIVE:  She appears well, afebrile. Urine dipstick: negative for all components.  ASSESSMENT:  Vaginal Discharge  Vaginal Odor   PLAN:  GC, chlamydia, trichomonas, BVAG, CVAG probe sent to lab. Treatment: To be determined once lab results are received. Pt has concerns about fibroids and recurring BV. Pt advised to schedule appt with a provider.  ROV prn if symptoms persist or worsen.

## 2023-02-18 ENCOUNTER — Encounter: Payer: Self-pay | Admitting: Obstetrics and Gynecology

## 2023-02-18 LAB — CERVICOVAGINAL ANCILLARY ONLY
Bacterial Vaginitis (gardnerella): POSITIVE — AB
Candida Glabrata: NEGATIVE
Candida Vaginitis: NEGATIVE
Chlamydia: NEGATIVE
Comment: NEGATIVE
Comment: NEGATIVE
Comment: NEGATIVE
Comment: NEGATIVE
Comment: NEGATIVE
Comment: NORMAL
Neisseria Gonorrhea: NEGATIVE
Trichomonas: NEGATIVE

## 2023-02-18 MED ORDER — METRONIDAZOLE 0.75 % VA GEL: 1.0000 | Freq: Every day | VAGINAL | 0 refills | Status: DC

## 2023-02-18 NOTE — Addendum Note (Signed)
Addended by: Harvie Bridge on: 02/18/2023 04:33 PM   Modules accepted: Orders

## 2023-02-19 LAB — URINE CULTURE

## 2023-02-21 ENCOUNTER — Encounter: Payer: Self-pay | Admitting: Obstetrics and Gynecology

## 2023-02-25 ENCOUNTER — Ambulatory Visit (INDEPENDENT_AMBULATORY_CARE_PROVIDER_SITE_OTHER): Payer: Medicaid Other | Admitting: Obstetrics

## 2023-02-25 ENCOUNTER — Encounter: Payer: Self-pay | Admitting: Obstetrics

## 2023-02-25 VITALS — BP 135/86 | HR 114 | Wt 110.0 lb

## 2023-02-25 DIAGNOSIS — Z3009 Encounter for other general counseling and advice on contraception: Secondary | ICD-10-CM

## 2023-02-25 DIAGNOSIS — N898 Other specified noninflammatory disorders of vagina: Secondary | ICD-10-CM | POA: Diagnosis not present

## 2023-02-25 DIAGNOSIS — N760 Acute vaginitis: Secondary | ICD-10-CM | POA: Diagnosis not present

## 2023-02-25 DIAGNOSIS — B9689 Other specified bacterial agents as the cause of diseases classified elsewhere: Secondary | ICD-10-CM

## 2023-02-25 DIAGNOSIS — N926 Irregular menstruation, unspecified: Secondary | ICD-10-CM | POA: Diagnosis not present

## 2023-02-25 DIAGNOSIS — Z3202 Encounter for pregnancy test, result negative: Secondary | ICD-10-CM

## 2023-02-25 LAB — POCT URINE PREGNANCY: Preg Test, Ur: NEGATIVE

## 2023-02-25 MED ORDER — METRONIDAZOLE 0.75 % VA GEL: 1.0000 | Freq: Two times a day (BID) | VAGINAL | 4 refills | Status: DC

## 2023-02-25 MED ORDER — METRONIDAZOLE 0.75 % VA GEL: 1.0000 | Freq: Every day | VAGINAL | 0 refills | Status: DC

## 2023-02-25 NOTE — Progress Notes (Signed)
 Patient ID: Molly Evans, female   DOB: 20-Jul-2000, 23 y.o.   MRN: [id number]   Molly Evans is a 23 y.o. female.  Missed period.  Has BV.  On MetroGel .  Negative subjective signs of pregnancy. Molly  Past Medical History:  Diagnosis Date   Skin condition resolved     Past Surgical History:  Procedure Laterality Date   NO PAST SURGERIES      Family History  Problem Relation Age of Onset   Hypertension Mother    Lupus Mother    Rheum arthritis Sister    Hypertension Sister    Heart disease Maternal Uncle    Rheum arthritis Maternal Grandmother    Cancer Neg Hx    Diabetes Neg Hx     Social History Social History   Tobacco Use   Smoking status: Former    Types: Cigars   Smokeless tobacco: Never  Vaping Use   Vaping status: Every Day   Substances: Flavoring  Substance Use Topics   Alcohol use: Yes    Comment: occasionally   Drug use: Yes    Types: Marijuana    Comment: daily    No Known Allergies  Current Outpatient Medications  Medication Sig Dispense Refill   metroNIDAZOLE  (FLAGYL ) 500 MG tablet Take 1 tablet (500 mg total) by mouth 2 (two) times daily. (Patient not taking: Reported on 02/25/2023) 14 tablet 0   metroNIDAZOLE  (METROGEL ) 0.75 % vaginal gel Place 1 Applicatorful vaginally at bedtime. Apply one applicatorful to vagina at bedtime for 5 days 70 g 0   phenazopyridine  (PYRIDIUM ) 200 MG tablet Take 1 tablet (200 mg total) by mouth 3 (three) times daily as needed for pain. (Patient not taking: Reported on 02/17/2023) 6 tablet 0   No current facility-administered medications for this visit.    Review of Systems Review of Systems Constitutional: negative for fatigue and weight loss Respiratory: negative for cough and wheezing Cardiovascular: negative for chest pain, fatigue and palpitations Gastrointestinal: negative for abdominal pain and change in bowel habits Genitourinary: positive for vaginal discharge and missed  period Integument/breast: negative for nipple discharge Musculoskeletal:negative for myalgias Neurological: negative for gait problems and tremors Behavioral/Psych: negative for abusive relationship, depression Endocrine: negative for temperature intolerance      Blood pressure 135/86, pulse (!) 114, weight 110 lb (49.9 kg), last menstrual period 01/24/2023.  Physical Exam Physical Exam General:   Alert and no distress  Skin:   no rash or abnormalities  Lungs:   clear to auscultation bilaterally  Heart:   regular rate and rhythm, S1, S2 normal, no murmur, click, rub or gallop  Breasts:   normal without suspicious masses, skin or nipple changes or axillary nodes  Abdomen:  normal findings: no organomegaly, soft, non-tender and no hernia  Pelvis:  External genitalia: normal general appearance Urinary system: urethral meatus normal and bladder without fullness, nontender Vaginal: normal without tenderness, induration or masses Cervix: normal appearance Adnexa: normal bimanual exam Uterus: anteverted and non-tender, normal size    I have spent a total of 20 minutes of face-to-face and non-face-to-face time, excluding clinical staff time, reviewing notes and preparing to see patient, ordering tests and/or medications, and counseling the patient.   Data Reviewed Wet Prep  Assessment     1. Missed period (Primary) Rx: - POCT urine pregnancy:  NEGATIVE  2. Vaginal discharge - has BV  3. BV (bacterial vaginosis) Rx: - metroNIDAZOLE  (METROGEL ) 0.75 % vaginal gel; Place 1 Applicatorful vaginally at bedtime. Apply  one applicatorful to vagina at bedtime for 5 days  Dispense: 70 g; Refill: 0  4. Encounter for other general counseling and advice on contraception - options discussed - declines contraception    Plan   Follow up prn  Orders Placed This Encounter  Procedures   POCT urine pregnancy   Meds ordered this encounter  Medications   metroNIDAZOLE  (METROGEL ) 0.75 %  vaginal gel    Sig: Place 1 Applicatorful vaginally at bedtime. Apply one applicatorful to vagina at bedtime for 5 days    Dispense:  70 g    Refill:  0    CARLIN RONAL CENTERS, MD, FACOG Attending Obstetrician & Gynecologist, [facility], [facility], [place] 02/25/2023

## 2023-02-25 NOTE — Progress Notes (Signed)
 Pt presents for GYN visit. Pt reports BV has resolved after finishing medication. Pt has concerns about fibroids, pt states she "feels constant movement in her stomach" Pt requests pregnancy test.

## 2023-04-17 ENCOUNTER — Ambulatory Visit (HOSPITAL_COMMUNITY)
Admission: RE | Admit: 2023-04-17 | Discharge: 2023-04-17 | Disposition: A | Discharge status: Home / Self Care | Payer: Self-pay | Source: Ambulatory Visit

## 2023-04-17 ENCOUNTER — Encounter (HOSPITAL_COMMUNITY): Payer: Self-pay

## 2023-04-17 VITALS — BP 112/76 | HR 74 | Temp 98.4°F | Resp 16 | Ht 62.0 in

## 2023-04-17 DIAGNOSIS — M25562 Pain in left knee: Secondary | ICD-10-CM | POA: Diagnosis not present

## 2023-04-17 DIAGNOSIS — Z3202 Encounter for pregnancy test, result negative: Secondary | ICD-10-CM

## 2023-04-17 DIAGNOSIS — M25561 Pain in right knee: Secondary | ICD-10-CM | POA: Insufficient documentation

## 2023-04-17 DIAGNOSIS — U071 COVID-19: Secondary | ICD-10-CM | POA: Diagnosis not present

## 2023-04-17 DIAGNOSIS — Z113 Encounter for screening for infections with a predominantly sexual mode of transmission: Secondary | ICD-10-CM | POA: Insufficient documentation

## 2023-04-17 LAB — POC COVID19/FLU A&B COMBO
Covid Antigen, POC: POSITIVE — AB
Influenza A Antigen, POC: NEGATIVE
Influenza B Antigen, POC: NEGATIVE

## 2023-04-17 LAB — POCT URINE PREGNANCY: Preg Test, Ur: NEGATIVE

## 2023-04-17 MED ORDER — MELOXICAM 7.5 MG PO TABS: 7.5000 mg | ORAL_TABLET | Freq: Every day | ORAL | 0 refills | Status: DC

## 2023-04-17 MED ORDER — MELOXICAM 7.5 MG PO TABS: 7.5000 mg | ORAL_TABLET | Freq: Every day | ORAL | 0 refills | Status: AC

## 2023-04-17 MED ORDER — AZELASTINE HCL 0.1 % NA SOLN: 1.0000 | Freq: Two times a day (BID) | NASAL | 1 refills | Status: AC

## 2023-04-17 NOTE — ED Triage Notes (Signed)
 Patient here today with c/o pain in both her knees that started on Thursday after getting off of work. Patient states that on Saturday she had a fever and chill but that went away after taking Theraflu and Ibuprofen. Patient thinks that she may be dehydrated.   Patient would also like to have Stds testing. (Swab only) Patient states that she has some vaginal odor X 1-2 days.

## 2023-04-17 NOTE — ED Provider Notes (Signed)
 [facility] [place]  Note:  This document was prepared using Dragon voice recognition software and may include unintentional dictation errors.  MRN: [id number] DOB: 06-30-2000  Subjective:   Molly Evans is a 23 y.o. female presenting for acute onset bilateral knee pain/aching, nasal congestion, fever with chills since Thursday after work.  Patient thinks that she may be dehydrated.  States that she took TheraFlu and ibuprofen yesterday and fever and chills became better.  Patient still reports bilateral knee aching and pain.  Patient also reports mild vaginal odor x 1 to 2 days and would like STD testing while she is in urgent care.  Denies any other secondary symptoms such as vaginal discharge, vaginal lesion, dysuria, increased urinary frequency, abdominal pain, flank pain.  No current facility-administered medications for this encounter.  Current Outpatient Medications:    azelastine (ASTELIN) 0.1 % nasal spray, Place 1 spray into both nostrils 2 (two) times daily. Use in each nostril as directed, Disp: 30 mL, Rfl: 1   meloxicam (MOBIC) 7.5 MG tablet, Take 1 tablet (7.5 mg total) by mouth daily., Disp: 30 tablet, Rfl: 0   No Known Allergies  Past Medical History:  Diagnosis Date   Skin condition resolved      Past Surgical History:  Procedure Laterality Date   NO PAST SURGERIES      Family History  Problem Relation Age of Onset   Hypertension Mother    Lupus Mother    Rheum arthritis Sister    Hypertension Sister    Heart disease Maternal Uncle    Rheum arthritis Maternal Grandmother    Cancer Neg Hx    Diabetes Neg Hx     Social History   Tobacco Use   Smoking status: Former    Types: Cigars   Smokeless tobacco: Never  Vaping Use   Vaping status: Every Day   Substances: Flavoring  Substance Use Topics   Alcohol use: Yes    Comment: occasionally   Drug use: Yes    Types: Marijuana    Comment: daily    ROS Refer to HPI for ROS  details.  Objective:   Vitals: BP 112/76 (BP Location: Right Arm)   Pulse 74   Temp 98.4 F (36.9 C) (Oral)   Resp 16   Ht 5\' 2"  (1.575 m)   LMP 04/11/2023 (Approximate)   SpO2 97%   BMI 20.12 kg/m   Physical Exam Vitals and nursing note reviewed.  Constitutional:      General: She is not in acute distress.    Appearance: Normal appearance. She is well-developed. She is not ill-appearing or toxic-appearing.  HENT:     Head: Normocephalic.     Nose: Nose normal.     Mouth/Throat:     Mouth: Mucous membranes are moist.     Pharynx: Oropharynx is clear.  Eyes:     Extraocular Movements: Extraocular movements intact.     Conjunctiva/sclera: Conjunctivae normal.  Cardiovascular:     Rate and Rhythm: Normal rate.  Pulmonary:     Effort: Pulmonary effort is normal. No respiratory distress.  Genitourinary:    Vagina: No vaginal discharge.  Musculoskeletal:     Right knee: No swelling, deformity, effusion, erythema or bony tenderness. Normal range of motion. No tenderness. Normal pulse.     Left knee: No swelling, deformity, effusion, erythema or bony tenderness. Normal range of motion. No tenderness. Normal pulse.  Skin:    General: Skin is warm and dry.  Neurological:  General: No focal deficit present.     Mental Status: She is alert and oriented to person, place, and time.  Psychiatric:        Mood and Affect: Mood normal.        Behavior: Behavior normal.     Procedures  Results for orders placed or performed during the hospital encounter of 04/17/23 (from the past 24 hours)  POCT urine pregnancy     Status: None   Collection Time: 04/17/23  1:56 PM  Result Value Ref Range   Preg Test, Ur Negative Negative  POC Covid19/Flu A&B Antigen     Status: Abnormal   Collection Time: 04/17/23  2:00 PM  Result Value Ref Range   Influenza A Antigen, POC Negative Negative   Influenza B Antigen, POC Negative Negative   Covid Antigen, POC Positive (A) Negative     Assessment and Plan :   PDMP not reviewed this encounter.  1. Acute pain of both knees   2. Screening for STD (sexually transmitted disease)   3. COVID-19    1. Acute pain of both knees (Primary) - meloxicam (MOBIC) 7.5 MG tablet; Take 1 tablet (7.5 mg total) by mouth daily.  Dispense: 30 tablet; Refill: 0  2. Screening for STD (sexually transmitted disease) - POCT urine pregnancy negative for hCG today in UC - Cervicovaginal swab collected in UC and sent to lab for further testing  3. COVID-19 - POC Covid19/Flu A&B Antigen performed in UC, negative for influenza positive for COVID-19 - azelastine (ASTELIN) 0.1 % nasal spray; Place 1 spray into both nostrils 2 (two) times daily. Use in each nostril as directed  Dispense: 30 mL; Refill: 1  -Continue to monitor symptoms for any change in severity if there is any escalation of current symptoms or development of new symptoms follow-up in ER for further evaluation and management.  [place]boy   [place], [address], [place] 04/17/23 1445

## 2023-04-17 NOTE — Discharge Instructions (Addendum)
 1. Acute pain of both knees (Primary) - meloxicam (MOBIC) 7.5 MG tablet; Take 1 tablet (7.5 mg total) by mouth daily.  Dispense: 30 tablet; Refill: 0  2. Screening for STD (sexually transmitted disease) - POCT urine pregnancy negative for hCG today in UC - Cervicovaginal swab collected in UC and sent to lab for further testing  3. COVID-19 - POC Covid19/Flu A&B Antigen performed in UC, negative for influenza positive for COVID-19 - azelastine (ASTELIN) 0.1 % nasal spray; Place 1 spray into both nostrils 2 (two) times daily. Use in each nostril as directed  Dispense: 30 mL; Refill: 1  -Continue to monitor symptoms for any change in severity if there is any escalation of current symptoms or development of new symptoms follow-up in ER for further evaluation and management.

## 2023-04-19 ENCOUNTER — Telehealth (HOSPITAL_COMMUNITY): Payer: Self-pay

## 2023-04-19 LAB — CERVICOVAGINAL ANCILLARY ONLY
Bacterial Vaginitis (gardnerella): POSITIVE — AB
Candida Glabrata: NEGATIVE
Candida Vaginitis: NEGATIVE
Chlamydia: NEGATIVE
Comment: NEGATIVE
Comment: NEGATIVE
Comment: NEGATIVE
Comment: NEGATIVE
Comment: NEGATIVE
Comment: NORMAL
Neisseria Gonorrhea: NEGATIVE
Trichomonas: NEGATIVE

## 2023-04-19 MED ORDER — METRONIDAZOLE 500 MG PO TABS: 500.0000 mg | ORAL_TABLET | Freq: Two times a day (BID) | ORAL | 0 refills | Status: AC

## 2023-04-19 NOTE — Telephone Encounter (Signed)
 Per protocol, pt requires tx with metronidazole. Rx sent to pharmacy on file.

## 2023-04-21 ENCOUNTER — Encounter (INDEPENDENT_AMBULATORY_CARE_PROVIDER_SITE_OTHER): Admitting: Primary Care

## 2023-04-27 ENCOUNTER — Telehealth (INDEPENDENT_AMBULATORY_CARE_PROVIDER_SITE_OTHER): Payer: Self-pay | Admitting: Primary Care

## 2023-04-27 NOTE — Telephone Encounter (Signed)
 Called pt to confirm appt. Pt will be present.

## 2023-05-04 ENCOUNTER — Encounter (INDEPENDENT_AMBULATORY_CARE_PROVIDER_SITE_OTHER): Admitting: Primary Care

## 2023-06-08 ENCOUNTER — Telehealth (INDEPENDENT_AMBULATORY_CARE_PROVIDER_SITE_OTHER): Payer: Self-pay | Admitting: Primary Care

## 2023-06-08 NOTE — Telephone Encounter (Signed)
 Called to confirm appt. LVM for pt about appt.

## 2023-06-15 ENCOUNTER — Ambulatory Visit (INDEPENDENT_AMBULATORY_CARE_PROVIDER_SITE_OTHER): Admitting: Primary Care

## 2023-06-16 ENCOUNTER — Telehealth (INDEPENDENT_AMBULATORY_CARE_PROVIDER_SITE_OTHER): Payer: Self-pay | Admitting: Primary Care

## 2023-06-16 NOTE — Telephone Encounter (Signed)
 Left VM with pt about their upcoming appt.

## 2023-06-17 ENCOUNTER — Ambulatory Visit (INDEPENDENT_AMBULATORY_CARE_PROVIDER_SITE_OTHER): Admitting: Primary Care

## 2023-06-30 ENCOUNTER — Ambulatory Visit (INDEPENDENT_AMBULATORY_CARE_PROVIDER_SITE_OTHER): Admitting: Primary Care

## 2023-07-14 ENCOUNTER — Encounter (INDEPENDENT_AMBULATORY_CARE_PROVIDER_SITE_OTHER): Admitting: Primary Care

## 2023-10-13 ENCOUNTER — Other Ambulatory Visit: Payer: Self-pay | Admitting: Obstetrics and Gynecology

## 2023-11-01 ENCOUNTER — Ambulatory Visit (HOSPITAL_COMMUNITY): Payer: Self-pay

## 2023-11-03 ENCOUNTER — Ambulatory Visit (HOSPITAL_COMMUNITY): Payer: Self-pay

## 2023-11-09 ENCOUNTER — Encounter (INDEPENDENT_AMBULATORY_CARE_PROVIDER_SITE_OTHER): Payer: Self-pay | Admitting: Primary Care

## 2023-11-09 ENCOUNTER — Ambulatory Visit (INDEPENDENT_AMBULATORY_CARE_PROVIDER_SITE_OTHER): Admitting: Primary Care

## 2023-11-09 ENCOUNTER — Other Ambulatory Visit (HOSPITAL_COMMUNITY)
Admission: RE | Admit: 2023-11-09 | Discharge: 2023-11-09 | Disposition: A | Source: Ambulatory Visit | Attending: Primary Care | Admitting: Primary Care

## 2023-11-09 VITALS — BP 116/71 | HR 70 | Resp 16 | Ht 62.0 in | Wt 117.0 lb

## 2023-11-09 DIAGNOSIS — L732 Hidradenitis suppurativa: Secondary | ICD-10-CM

## 2023-11-09 DIAGNOSIS — N898 Other specified noninflammatory disorders of vagina: Secondary | ICD-10-CM | POA: Diagnosis not present

## 2023-11-09 DIAGNOSIS — Z0000 Encounter for general adult medical examination without abnormal findings: Secondary | ICD-10-CM

## 2023-11-09 DIAGNOSIS — N921 Excessive and frequent menstruation with irregular cycle: Secondary | ICD-10-CM

## 2023-11-09 NOTE — Progress Notes (Unsigned)
 BP 116/71   Pulse 70   Resp 16   Ht 5' 2 (1.575 m)   Wt 117 lb (53.1 kg)   LMP 11/06/2023   SpO2 100%   BMI 21.40 kg/m    Subjective:    Patient ID: Dwain LITTIE Cone, female    DOB: 13-Feb-2000, 23 y.o.   MRN: [id number]  HPI: ENEDINA PAIR is a 23 y.o. female presenting on 11/09/2023 for comprehensive medical examination. Current medical complaints include bumps in pubic area   She currently lives with: alone with daughter Depression Screen done today and results listed below:     11/09/2023    8:55 AM 03/09/2022    2:11 PM 08/21/2021    8:51 AM 01/05/2021    2:13 PM 10/25/2018   11:25 AM  Depression screen PHQ 2/9  Decreased Interest 0 0 0 0 0  Down, Depressed, Hopeless 0 0 3 0 0  PHQ - 2 Score 0 0 3 0 0  Altered sleeping  0 3 0   Tired, decreased energy  0 2 0   Change in appetite  0 0 0   Feeling bad or failure about yourself   0  0   Trouble concentrating  0 0 0   Moving slowly or fidgety/restless  0 0 0   Suicidal thoughts  0 0 0   PHQ-9 Score  0 8 0   Difficult doing work/chores   Not difficult at all Not difficult at all     The patient does not have a history of falls. I did not complete a risk assessment for falls. A plan of care for falls was not documented.   Past Medical History:  Past Medical History:  Diagnosis Date  . Skin condition resolved     Surgical History:  Past Surgical History:  Procedure Laterality Date  . NO PAST SURGERIES      Medications:  Current Outpatient Medications on File Prior to Visit  Medication Sig  . azelastine  (ASTELIN ) 0.1 % nasal spray Place 1 spray into both nostrils 2 (two) times daily. Use in each nostril as directed  . meloxicam  (MOBIC ) 7.5 MG tablet Take 1 tablet (7.5 mg total) by mouth daily. (Patient not taking: Reported on 11/09/2023)   No current facility-administered medications on file prior to visit.    Allergies:  No Known Allergies  Social History:  Social History   Socioeconomic  History  . Marital status: Single    Spouse name: Not on file  . Number of children: 1  . Years of education: Not on file  . Highest education level: Not on file  Occupational History  . Not on file  Tobacco Use  . Smoking status: Former    Types: Cigars  . Smokeless tobacco: Never  Vaping Use  . Vaping status: Every Day  . Substances: Flavoring  Substance and Sexual Activity  . Alcohol use: Yes    Comment: occasionally  . Drug use: Yes    Types: Marijuana    Comment: daily  . Sexual activity: Yes    Partners: Male    Birth control/protection: None  Other Topics Concern  . Not on file  Social History Narrative   8th grade, desiring to go out for track 2014, does hair styling, exercise - running   Social Drivers of Health   Financial Resource Strain: Not on file  Food Insecurity: Not on file  Transportation Needs: Not on file  Physical Activity: Not on file  Stress: Not on file  Social Connections: Not on file  Intimate Partner Violence: Not on file   Social History   Tobacco Use  Smoking Status Former  . Types: Cigars  Smokeless Tobacco Never   Social History   Substance and Sexual Activity  Alcohol Use Yes   Comment: occasionally    Family History:  Family History  Problem Relation Age of Onset  . Hypertension Mother   . Lupus Mother   . Rheum arthritis Sister   . Hypertension Sister   . Heart disease Maternal Uncle   . Rheum arthritis Maternal Grandmother   . Cancer Neg Hx   . Diabetes Neg Hx     Past medical history, surgical history, medications, allergies, family history and social history reviewed with patient today and changes made to appropriate areas of the chart.   Review of Systems - {ros [id number]} All other ROS negative except what is listed above and in the HPI.      Objective:    BP 116/71   Pulse 70   Resp 16   Ht 5' 2 (1.575 m)   Wt 117 lb (53.1 kg)   LMP 11/06/2023   SpO2 100%   BMI 21.40 kg/m   Wt Readings from  Last 3 Encounters:  11/09/23 117 lb (53.1 kg)  02/25/23 110 lb (49.9 kg)  02/17/23 110 lb 3.2 oz (50 kg)    Physical Exam Vitals reviewed.  Constitutional:      Appearance: Normal appearance.  HENT:     Head: Normocephalic.     Right Ear: Tympanic membrane, ear canal and external ear normal.     Left Ear: Tympanic membrane, ear canal and external ear normal.     Nose: Nose normal.     Mouth/Throat:     Mouth: Mucous membranes are moist.  Eyes:     Extraocular Movements: Extraocular movements intact.     Pupils: Pupils are equal, round, and reactive to light.  Cardiovascular:     Rate and Rhythm: Normal rate.  Pulmonary:     Effort: Pulmonary effort is normal.     Breath sounds: Normal breath sounds.  Abdominal:     General: Bowel sounds are normal.     Palpations: Abdomen is soft.  Genitourinary:    Comments: tenderness, induration or masses pubic area  Musculoskeletal:        General: Normal range of motion.     Cervical back: Normal range of motion.  Skin:    General: Skin is warm and dry.  Neurological:     Mental Status: She is alert and oriented to person, place, and time.  Psychiatric:        Mood and Affect: Mood normal.        Behavior: Behavior normal.        Thought Content: Thought content normal.     Results for orders placed or performed during the hospital encounter of 04/17/23  Cervicovaginal ancillary only   Collection Time: 04/17/23  1:52 PM  Result Value Ref Range   Neisseria Gonorrhea Negative    Chlamydia Negative    Trichomonas Negative    Bacterial Vaginitis (gardnerella) Positive (A)    Candida Vaginitis Negative    Candida Glabrata Negative    Comment      Normal Reference Range Bacterial Vaginosis - Negative   Comment Normal Reference Ranger Chlamydia - Negative    Comment      Normal Reference Range Neisseria Gonorrhea - Negative  Comment Normal Reference Range Candida Species - Negative    Comment Normal Reference Range Candida  Galbrata - Negative    Comment Normal Reference Range Trichomonas - Negative   POCT urine pregnancy   Collection Time: 04/17/23  1:56 PM  Result Value Ref Range   Preg Test, Ur Negative Negative  POC Covid19/Flu A&B Antigen   Collection Time: 04/17/23  2:00 PM  Result Value Ref Range   Influenza A Antigen, POC Negative Negative   Influenza B Antigen, POC Negative Negative   Covid Antigen, POC Positive (A) Negative      Assessment & Plan:   Follow up plan:    IMMUNIZATIONS:  declines all vaccinations SCREENING: N/A  PATIENT COUNSELING:   Advised to take 1 mg of folate supplement per day if capable of pregnancy.   Sexuality: Discussed sexually transmitted diseases, partner selection, use of condoms, avoidance of unintended pregnancy  and contraceptive alternatives.   Advised to avoid cigarette smoking.  I discussed with the patient that most people either abstain from alcohol or drink within safe limits (<=14/week and <=4 drinks/occasion for males, <=7/weeks and <= 3 drinks/occasion for females) and that the risk for alcohol disorders and other health effects rises proportionally with the number of drinks per week and how often a drinker exceeds daily limits.  Discussed cessation/primary prevention of drug use and availability of treatment for abuse.   Diet: Encouraged to adjust caloric intake to maintain  increase ideal body weight, reduce intake of dietary saturated fat and total fat, to limit sodium intake by avoiding high sodium foods and not adding table salt, and to maintain adequate dietary potassium and calcium preferably from fresh fruits, vegetables, and low-fat dairy products.    stressed the importance of regular exercise  Injury prevention: Discussed safety belts, safety helmets, smoke detector, smoking near bedding or upholstery.   Dental health: Discussed importance of regular tooth brushing, flossing, and dental visits.    NEXT PREVENTATIVE PHYSICAL DUE IN 1  YEAR. No follow-ups on file.

## 2023-11-09 NOTE — Patient Instructions (Signed)
HPV and Cancer Information Human papillomavirus (HPV) is a common virus. There are more than 100 types of HPV. Most cases of HPV infections go away on their own within 2 years, but other HPV infections are considered high-risk and may cause changes in cells that could lead to cancer. You can take steps to avoid HPV infection and to lower your risk of getting cancer. How can HPV affect me? Often, HPV infection does not cause any symptoms. However, HPV can cause warts in the genitals (genital or mucosal HPV)or on the hands or feet (cutaneous or nonmucosal HPV). It can also cause wart-like bumps in the throat. Certain types of genital HPV can also cause cancer, which may include: Cancer of the cervix. Cancer of the vagina. Cancer of the outer female genital area (vulva). Cancer of the anus. Cancer of the tongue, tonsils, and throat. Cancer of the penis. How does HPV spread? HPV spreads easily through direct person to person contact. Genital HPV spreads through sexual contact. You can get HPV from vaginal sex, oral sex, anal sex, or just by touching someone's genitals. Even people who have only one sexual partner may have HPV because that partner may have it. HPV often does not cause symptoms, so most infected people do not know that they have it. What actions can I take to prevent HPV?     Take the following steps to help prevent HPV infection: Talk with your health care provider about getting the HPV vaccine. This vaccine protects against the types of HPV that could cause cancer. Limit the number of people you have sex with. Also, avoid having sex with people who have had many sexual partners. Use a new latex or polyurethane condom for each sex act. Talk with your sexual partners about their health. What actions can I take to lower my risk for cancer? Having a healthy lifestyle and taking some preventive steps can help lower your cancer risk, whether or not you have genital HPV. Some steps you  can take include: Lifestyle Do not use any products that contain nicotine or tobacco. These products include cigarettes, chewing tobacco, and vaping devices, such as e-cigarettes. If you need help quitting, ask your provider. Eat healthy foods such as fruits, vegetables, and grains. Try to eat at least 5 servings of fruits and vegetables every day. Get regular exercise. Lose weight if you are overweight. Practice good oral hygiene. This includes flossing and brushing your teeth every day. Other preventive steps Get the HPV vaccine as told by your provider. Practice safe sex. Use a protective barrier, such as a condom, every time you have sex. Get tested for sexually transmitted infections (STIs) even if you do not have symptoms of HPV. You may have HPV and not know it. If you are female, get regular Pap and HPV tests. Talk with your provider about how often you need these tests. Pap tests will help identify changes in cells that can lead to cancer. HPV tests will help detect if there is HPV infection in the cells taken from the cervix. Where to find more information Centers for Disease Control and Prevention: TonerPromos.no National Cancer Institute: cancer.gov American Cancer Society: cancer.org This information is not intended to replace advice given to you by your health care provider. Make sure you discuss any questions you have with your health care provider. Document Revised: 10/23/2021 Document Reviewed: 09/11/2021 Elsevier Patient Education  2024 [employer].

## 2023-11-10 LAB — CMP14+EGFR
ALT: 21 IU/L (ref 0–32)
AST: 18 IU/L (ref 0–40)
Albumin: 4.4 g/dL (ref 4.0–5.0)
Alkaline Phosphatase: 58 IU/L (ref 41–116)
BUN/Creatinine Ratio: 10 (ref 9–23)
BUN: 7 mg/dL (ref 6–20)
Bilirubin Total: 0.3 mg/dL (ref 0.0–1.2)
CO2: 23 mmol/L (ref 20–29)
Calcium: 9.6 mg/dL (ref 8.7–10.2)
Chloride: 105 mmol/L (ref 96–106)
Creatinine, Ser: 0.71 mg/dL (ref 0.57–1.00)
Globulin, Total: 2.5 g/dL (ref 1.5–4.5)
Glucose: 59 mg/dL — ABNORMAL LOW (ref 70–99)
Potassium: 4.3 mmol/L (ref 3.5–5.2)
Sodium: 141 mmol/L (ref 134–144)
Total Protein: 6.9 g/dL (ref 6.0–8.5)
eGFR: 122 mL/min/1.73 (ref >59)

## 2023-11-10 LAB — CBC WITH DIFFERENTIAL/PLATELET
Basophils Absolute: 0 x10E3/uL (ref 0.0–0.2)
Basos: 0 %
EOS (ABSOLUTE): 0 x10E3/uL (ref 0.0–0.4)
Eos: 1 %
Hematocrit: 42 % (ref 34.0–46.6)
Hemoglobin: 13.4 g/dL (ref 11.1–15.9)
Immature Grans (Abs): 0 x10E3/uL (ref 0.0–0.1)
Immature Granulocytes: 0 %
Lymphocytes Absolute: 1.2 x10E3/uL (ref 0.7–3.1)
Lymphs: 25 %
MCH: 27.6 pg (ref 26.6–33.0)
MCHC: 31.9 g/dL (ref 31.5–35.7)
MCV: 87 fL (ref 79–97)
Monocytes Absolute: 0.3 x10E3/uL (ref 0.1–0.9)
Monocytes: 6 %
Neutrophils Absolute: 3.2 x10E3/uL (ref 1.4–7.0)
Neutrophils: 68 %
Platelets: 244 x10E3/uL (ref 150–450)
RBC: 4.85 x10E6/uL (ref 3.77–5.28)
RDW: 12.3 % (ref 11.7–15.4)
WBC: 4.7 x10E3/uL (ref 3.4–10.8)

## 2023-11-10 LAB — CERVICOVAGINAL ANCILLARY ONLY
Bacterial Vaginitis (gardnerella): POSITIVE — AB
Candida Glabrata: NEGATIVE
Candida Vaginitis: NEGATIVE
Chlamydia: NEGATIVE
Comment: NEGATIVE
Comment: NEGATIVE
Comment: NEGATIVE
Comment: NEGATIVE
Comment: NEGATIVE
Comment: NORMAL
Neisseria Gonorrhea: NEGATIVE
Trichomonas: NEGATIVE

## 2023-11-10 MED ORDER — DOXYCYCLINE HYCLATE 100 MG PO TABS: 100.0000 mg | ORAL_TABLET | Freq: Two times a day (BID) | ORAL | 0 refills | Status: AC

## 2023-11-14 ENCOUNTER — Ambulatory Visit (INDEPENDENT_AMBULATORY_CARE_PROVIDER_SITE_OTHER): Payer: Self-pay | Admitting: Primary Care

## 2023-11-14 DIAGNOSIS — B9689 Other specified bacterial agents as the cause of diseases classified elsewhere: Secondary | ICD-10-CM

## 2023-11-14 MED ORDER — METRONIDAZOLE 500 MG PO TABS: 500.0000 mg | ORAL_TABLET | Freq: Two times a day (BID) | ORAL | 0 refills | Status: AC

## 2023-11-29 ENCOUNTER — Ambulatory Visit (INDEPENDENT_AMBULATORY_CARE_PROVIDER_SITE_OTHER): Admitting: Primary Care

## 2024-01-08 ENCOUNTER — Ambulatory Visit (HOSPITAL_COMMUNITY): Payer: Self-pay

## 2024-01-09 ENCOUNTER — Ambulatory Visit (HOSPITAL_COMMUNITY): Payer: Self-pay
